# Patient Record
Sex: Female | Born: 1940 | Race: White | Hispanic: No | Marital: Married | Smoking: Never smoker
Health system: Southern US, Community
[De-identification: ages and names within clinical notes are randomized; demographics above are authoritative.]

## PROBLEM LIST (undated history)

## (undated) DIAGNOSIS — M1990 Unspecified osteoarthritis, unspecified site: Secondary | ICD-10-CM

## (undated) DIAGNOSIS — I10 Essential (primary) hypertension: Secondary | ICD-10-CM

## (undated) DIAGNOSIS — R011 Cardiac murmur, unspecified: Secondary | ICD-10-CM

## (undated) DIAGNOSIS — C801 Malignant (primary) neoplasm, unspecified: Secondary | ICD-10-CM

## (undated) HISTORY — DX: Unspecified osteoarthritis, unspecified site: M19.90

## (undated) HISTORY — PX: TOTAL KNEE ARTHROPLASTY: SHX125

## (undated) HISTORY — DX: Essential (primary) hypertension: I10

## (undated) HISTORY — PX: TONSILLECTOMY: SUR1361

## (undated) HISTORY — PX: ABDOMINAL HYSTERECTOMY: SHX81

## (undated) HISTORY — DX: Malignant (primary) neoplasm, unspecified: C80.1

## (undated) HISTORY — PX: TUBAL LIGATION: SHX77

## (undated) HISTORY — DX: Cardiac murmur, unspecified: R01.1

## (undated) HISTORY — PX: EYE SURGERY: SHX253

---

## 1998-02-24 ENCOUNTER — Ambulatory Visit (HOSPITAL_COMMUNITY): Admission: RE | Admit: 1998-02-24 | Discharge: 1998-02-24 | Payer: Self-pay | Admitting: Unknown Physician Specialty

## 2000-02-14 ENCOUNTER — Encounter: Payer: Self-pay | Admitting: Family Medicine

## 2000-02-14 ENCOUNTER — Encounter: Admission: RE | Admit: 2000-02-14 | Discharge: 2000-02-14 | Payer: Self-pay | Admitting: Family Medicine

## 2001-03-04 ENCOUNTER — Encounter: Admission: RE | Admit: 2001-03-04 | Discharge: 2001-03-04 | Payer: Self-pay | Admitting: Family Medicine

## 2001-03-04 ENCOUNTER — Encounter: Payer: Self-pay | Admitting: Family Medicine

## 2002-03-18 ENCOUNTER — Encounter: Payer: Self-pay | Admitting: Family Medicine

## 2002-03-18 ENCOUNTER — Encounter: Admission: RE | Admit: 2002-03-18 | Discharge: 2002-03-18 | Payer: Self-pay | Admitting: Family Medicine

## 2003-03-23 ENCOUNTER — Encounter: Admission: RE | Admit: 2003-03-23 | Discharge: 2003-03-23 | Payer: Self-pay | Admitting: Family Medicine

## 2004-04-13 ENCOUNTER — Encounter: Admission: RE | Admit: 2004-04-13 | Discharge: 2004-04-13 | Payer: Self-pay | Admitting: Family Medicine

## 2004-05-31 ENCOUNTER — Ambulatory Visit (HOSPITAL_COMMUNITY): Admission: RE | Admit: 2004-05-31 | Discharge: 2004-05-31 | Payer: Self-pay | Admitting: Gastroenterology

## 2005-05-01 ENCOUNTER — Encounter: Admission: RE | Admit: 2005-05-01 | Discharge: 2005-05-01 | Payer: Self-pay | Admitting: Family Medicine

## 2006-05-04 ENCOUNTER — Encounter: Admission: RE | Admit: 2006-05-04 | Discharge: 2006-05-04 | Payer: Self-pay | Admitting: Family Medicine

## 2007-05-14 ENCOUNTER — Encounter: Admission: RE | Admit: 2007-05-14 | Discharge: 2007-05-14 | Payer: Self-pay | Admitting: Family Medicine

## 2008-05-19 ENCOUNTER — Encounter: Admission: RE | Admit: 2008-05-19 | Discharge: 2008-05-19 | Payer: Self-pay | Admitting: Internal Medicine

## 2008-05-21 ENCOUNTER — Encounter: Admission: RE | Admit: 2008-05-21 | Discharge: 2008-05-21 | Payer: Self-pay | Admitting: Internal Medicine

## 2009-05-24 ENCOUNTER — Encounter: Admission: RE | Admit: 2009-05-24 | Discharge: 2009-05-24 | Payer: Self-pay | Admitting: Internal Medicine

## 2010-05-25 ENCOUNTER — Encounter
Admission: RE | Admit: 2010-05-25 | Discharge: 2010-05-25 | Payer: Self-pay | Source: Home / Self Care | Attending: Internal Medicine | Admitting: Internal Medicine

## 2010-09-30 NOTE — Op Note (Signed)
[name], [name]               ACCOUNT NO.:  [id number]   MEDICAL RECORD NO.:  [id number]          PATIENT TYPE:  AMB   LOCATION:  ENDO                         FACILITY:  [facility]   PHYSICIAN:  James L. Malon Kindle., M.D.DATE OF BIRTH:  [birth year]   DATE OF PROCEDURE:  05/31/2004  DATE OF DISCHARGE:                                 OPERATIVE REPORT   PROCEDURE:  Colonoscopy.   MEDICATIONS:  1.  Fentanyl 60 mcg.  2.  Versed 7.5 mg IV.   SCOPE:  Olympus pediatric adjustable colonoscope.   INDICATION:  Colon cancer screening.   DESCRIPTION OF PROCEDURE:  The procedure had been explained to the patient  and consent obtained.  The patient in left lateral decubitus position, the  Olympus scope was inserted and advanced.  The prep was excellent.  The  patient has slight diverticulosis.  We were able to pass this easily and  advance to the cecum.  The ileocecal valve was seen.  The scope was  withdrawn, and the cecum, ascending colon, transverse colon, splenic  flexure, descending and sigmoid colon were seen well.  The scope was  withdrawn.  The patient tolerated the procedure well.  No polyps were seen  throughout the entire colon.  In addition, the patient had received  Phenergan 12.5 mg IV due to complaints of nausea with previous sedations.   ASSESSMENT:  Normal screening colonoscopy.  V76.51.   PLAN:  We will recommend yearly Hemoccults and possibly repeat procedure in  10 years.       JLE/MEDQ  D:  05/31/2004  T:  05/31/2004  Job:  [id number]   cc:   Duncan Dull, M.D.  [address]  [place]  [place] [address]  Fax: [phone]

## 2011-05-01 ENCOUNTER — Other Ambulatory Visit: Payer: Self-pay | Admitting: Internal Medicine

## 2011-05-01 DIAGNOSIS — Z1231 Encounter for screening mammogram for malignant neoplasm of breast: Secondary | ICD-10-CM

## 2011-05-31 ENCOUNTER — Ambulatory Visit: Payer: Self-pay

## 2011-06-02 ENCOUNTER — Ambulatory Visit: Payer: Self-pay

## 2011-06-16 ENCOUNTER — Ambulatory Visit
Admission: RE | Admit: 2011-06-16 | Discharge: 2011-06-16 | Disposition: A | Discharge status: Home / Self Care | Payer: Medicare Other | Source: Ambulatory Visit | Attending: Internal Medicine | Admitting: Internal Medicine

## 2011-06-16 DIAGNOSIS — Z1231 Encounter for screening mammogram for malignant neoplasm of breast: Secondary | ICD-10-CM

## 2012-06-13 ENCOUNTER — Other Ambulatory Visit: Payer: Self-pay | Admitting: Internal Medicine

## 2012-06-13 DIAGNOSIS — Z1231 Encounter for screening mammogram for malignant neoplasm of breast: Secondary | ICD-10-CM

## 2012-07-11 ENCOUNTER — Ambulatory Visit
Admission: RE | Admit: 2012-07-11 | Discharge: 2012-07-11 | Disposition: A | Discharge status: Home / Self Care | Payer: Medicare Other | Source: Ambulatory Visit | Attending: Internal Medicine | Admitting: Internal Medicine

## 2012-07-11 DIAGNOSIS — Z1231 Encounter for screening mammogram for malignant neoplasm of breast: Secondary | ICD-10-CM

## 2013-06-23 ENCOUNTER — Other Ambulatory Visit: Payer: Self-pay

## 2013-06-23 DIAGNOSIS — Z1231 Encounter for screening mammogram for malignant neoplasm of breast: Secondary | ICD-10-CM

## 2013-07-14 ENCOUNTER — Ambulatory Visit
Admission: RE | Admit: 2013-07-14 | Discharge: 2013-07-14 | Disposition: A | Discharge status: Home / Self Care | Payer: Medicare Other | Source: Ambulatory Visit

## 2013-07-14 DIAGNOSIS — Z1231 Encounter for screening mammogram for malignant neoplasm of breast: Secondary | ICD-10-CM

## 2014-07-07 ENCOUNTER — Other Ambulatory Visit: Payer: Self-pay

## 2014-07-07 DIAGNOSIS — Z1231 Encounter for screening mammogram for malignant neoplasm of breast: Secondary | ICD-10-CM

## 2014-07-16 ENCOUNTER — Ambulatory Visit
Admission: RE | Admit: 2014-07-16 | Discharge: 2014-07-16 | Disposition: A | Discharge status: Home / Self Care | Payer: Medicare Other | Source: Ambulatory Visit

## 2014-07-16 DIAGNOSIS — Z1231 Encounter for screening mammogram for malignant neoplasm of breast: Secondary | ICD-10-CM

## 2015-06-18 ENCOUNTER — Other Ambulatory Visit: Payer: Self-pay

## 2015-06-18 DIAGNOSIS — Z1231 Encounter for screening mammogram for malignant neoplasm of breast: Secondary | ICD-10-CM

## 2015-07-19 ENCOUNTER — Ambulatory Visit
Admission: RE | Admit: 2015-07-19 | Discharge: 2015-07-19 | Disposition: A | Discharge status: Home / Self Care | Payer: Medicare Other | Source: Ambulatory Visit

## 2015-07-19 DIAGNOSIS — Z1231 Encounter for screening mammogram for malignant neoplasm of breast: Secondary | ICD-10-CM

## 2016-06-14 ENCOUNTER — Other Ambulatory Visit: Payer: Self-pay | Admitting: Internal Medicine

## 2016-06-14 DIAGNOSIS — Z1231 Encounter for screening mammogram for malignant neoplasm of breast: Secondary | ICD-10-CM

## 2016-07-20 ENCOUNTER — Ambulatory Visit
Admission: RE | Admit: 2016-07-20 | Discharge: 2016-07-20 | Disposition: A | Discharge status: Home / Self Care | Payer: Medicare Other | Source: Ambulatory Visit | Attending: Internal Medicine | Admitting: Internal Medicine

## 2016-07-20 DIAGNOSIS — Z1231 Encounter for screening mammogram for malignant neoplasm of breast: Secondary | ICD-10-CM

## 2017-06-26 ENCOUNTER — Other Ambulatory Visit: Payer: Self-pay | Admitting: Internal Medicine

## 2017-06-26 DIAGNOSIS — Z1231 Encounter for screening mammogram for malignant neoplasm of breast: Secondary | ICD-10-CM

## 2017-07-23 ENCOUNTER — Ambulatory Visit
Admission: RE | Admit: 2017-07-23 | Discharge: 2017-07-23 | Disposition: A | Discharge status: Home / Self Care | Payer: Medicare Other | Source: Ambulatory Visit | Attending: Internal Medicine | Admitting: Internal Medicine

## 2017-07-23 DIAGNOSIS — Z1231 Encounter for screening mammogram for malignant neoplasm of breast: Secondary | ICD-10-CM

## 2018-06-13 ENCOUNTER — Other Ambulatory Visit: Payer: Self-pay | Admitting: Internal Medicine

## 2018-06-13 DIAGNOSIS — Z1231 Encounter for screening mammogram for malignant neoplasm of breast: Secondary | ICD-10-CM

## 2018-07-25 ENCOUNTER — Ambulatory Visit
Admission: RE | Admit: 2018-07-25 | Discharge: 2018-07-25 | Disposition: A | Discharge status: Home / Self Care | Payer: Medicare Other | Source: Ambulatory Visit | Attending: Internal Medicine | Admitting: Internal Medicine

## 2018-07-25 DIAGNOSIS — Z1231 Encounter for screening mammogram for malignant neoplasm of breast: Secondary | ICD-10-CM

## 2019-06-15 ENCOUNTER — Ambulatory Visit: Payer: Medicare Other

## 2019-07-06 ENCOUNTER — Ambulatory Visit: Payer: Medicare Other

## 2019-12-10 NOTE — Patient Instructions (Addendum)
DUE TO COVID-19 ONLY ONE VISITOR IS ALLOWED TO COME WITH YOU AND STAY IN THE WAITING ROOM ONLY DURING PRE OP AND PROCEDURE DAY OF SURGERY. THE 1 VISITOR MAY VISIT WITH YOU AFTER SURGERY IN YOUR PRIVATE ROOM DURING VISITING HOURS ONLY!  YOU NEED TO HAVE A COVID 19 TEST ON: 12/18/19 @ 10:00 am  , THIS TEST MUST BE DONE BEFORE SURGERY, COME  COVID TESTING SITE [address] [place] [address], IT IS ON THE RIGHT GOING OUT [address] APPROXIT[address]TELY 2 MINUTES PAST [employer] ON THE RIGHT. ONCE YOUR COVID TEST IS COMPLETED,  PLEASE BEGIN THE QUARANTINE INSTRUCTIONS AS OUTLINED IN YOUR HANDOUT.                Vicki Mallet    Your procedure is scheduled on: 12/22/19   Report to [facility] Main  Entrance   Report to admitting at: 10:10 AM     Call this number if you have problems the morning of surgery [phone]    Remember:   NO SOLID FOOD AFTER MIDNIGHT THE NIGHT PRIOR TO SURGERY. NOTHING BY MOUTH EXCEPT CLEAR LIQUIDS UNTIL : 9:30 am. PLEASE FINISH ENSURE DRINK PER SURGEON ORDER  WHICH NEEDS TO BE COMPLETED AT: 9:30 am .   CLEAR LIQUID DIET   Foods Allowed                                                                     Foods Excluded  Coffee and tea, regular and decaf                             liquids that you cannot  Plain Jell-O any favor except red or purple                                           see through such as: Fruit ices (not with fruit pulp)                                     milk, soups, orange juice  Iced Popsicles                                    All solid food Carbonated beverages, regular and diet                                    Cranberry, grape and apple juices Sports drinks like Gatorade Lightly seasoned clear broth or consume(fat free) Sugar, honey syrup  Sample Menu Breakfast                                Lunch  Supper Cranberry juice                    Beef broth                             Chicken broth Jell-O                                     Grape juice                           Apple juice Coffee or tea                        Jell-O                                      Popsicle                                                Coffee or tea                        Coffee or tea  _____________________________________________________________________   BRUSH YOUR TEETH MORNING OF SURGERY AND RINSE YOUR MOUTH OUT, NO CHEWING GUM CANDY OR MINTS.     Take these medicines the morning of surgery with A SIP OF WATER: finasteride,metoprolol,omeprazole.Eye drops as usual.                                You may not have any metal on your body including hair pins and              piercings  Do not wear jewelry, make-up, lotions, powders or perfumes, deodorant             Do not wear nail polish on your fingernails.  Do not shave  48 hours prior to surgery.     Do not bring valuables to the hospital. [place].  Contacts, dentures or bridgework may not be worn into surgery.  Leave suitcase in the car. After surgery it may be brought to your room.     Patients discharged the day of surgery will not be allowed to drive home. IF YOU ARE HAVING SURGERY AND GOING HOME THE SAME DAY, YOU MUST HAVE AN ADULT TO DRIVE YOU HOME AND BE WITH YOU FOR 24 HOURS. YOU MAY GO HOME BY TAXI OR UBER OR ORTHERWISE, BUT AN ADULT MUST ACCOMPANY YOU HOME AND STAY WITH YOU FOR 24 HOURS.  Name and phone number of your driver:  Special Instructions: N/A              Please read over the following fact sheets you were given: _____________________________________________________________________          [facility] - Preparing for Surgery Before surgery, you can play an important role.  Because skin is not sterile, your skin needs to be as free  of germs as possible.  You can reduce the number of germs on your skin by washing with CHG (chlorahexidine  gluconate) soap before surgery.  CHG is an antiseptic cleaner which kills germs and bonds with the skin to continue killing germs even after washing. Please DO NOT use if you have an allergy to CHG or antibacterial soaps.  If your skin becomes reddened/irritated stop using the CHG and inform your nurse when you arrive at Short Stay. Do not shave (including legs and underarms) for at least 48 hours prior to the first CHG shower.  You may shave your face/neck. Please follow these instructions carefully:  1.  Shower with CHG Soap the night before surgery and the  morning of Surgery.  2.  If you choose to wash your hair, wash your hair first as usual with your  normal  shampoo.  3.  After you shampoo, rinse your hair and body thoroughly to remove the  shampoo.                           4.  Use CHG as you would any other liquid soap.  You can apply chg directly  to the skin and wash                       Gently with a scrungie or clean washcloth.  5.  Apply the CHG Soap to your body ONLY FROM THE NECK DOWN.   Do not use on face/ open                           Wound or open sores. Avoid contact with eyes, ears mouth and genitals (private parts).                       Wash face,  Genitals (private parts) with your normal soap.             6.  Wash thoroughly, paying special attention to the area where your surgery  will be performed.  7.  Thoroughly rinse your body with warm water from the neck down.  8.  DO NOT shower/wash with your normal soap after using and rinsing off  the CHG Soap.                9.  Pat yourself dry with a clean towel.            10.  Wear clean pajamas.            11.  Place clean sheets on your bed the night of your first shower and do not  sleep with pets. Day of Surgery : Do not apply any lotions/deodorants the morning of surgery.  Please wear clean clothes to the hospital/surgery center.  FAILURE TO FOLLOW THESE INSTRUCTIONS MAY RESULT IN THE CANCELLATION OF YOUR  SURGERY PATIENT SIGNATURE_________________________________  NURSE SIGNATURE__________________________________  ________________________________________________________________________   Adam Phenix  An incentive spirometer is a tool that can help keep your lungs clear and active. This tool measures how well you are filling your lungs with each breath. Taking long deep breaths may help reverse or decrease the chance of developing breathing (pulmonary) problems (especially infection) following:  A long period of time when you are unable to move or be active. BEFORE THE PROCEDURE   If the spirometer includes an indicator to show your best effort, your  nurse or respiratory therapist will set it to a desired goal.  If possible, sit up straight or lean slightly forward. Try not to slouch.  Hold the incentive spirometer in an upright position. INSTRUCTIONS FOR USE  1. Sit on the edge of your bed if possible, or sit up as far as you can in bed or on a chair. 2. Hold the incentive spirometer in an upright position. 3. Breathe out normally. 4. Place the mouthpiece in your mouth and seal your lips tightly around it. 5. Breathe in slowly and as deeply as possible, raising the piston or the ball toward the top of the column. 6. Hold your breath for 3-5 seconds or for as long as possible. Allow the piston or ball to fall to the bottom of the column. 7. Remove the mouthpiece from your mouth and breathe out normally. 8. Rest for a few seconds and repeat Steps 1 through 7 at least 10 times every 1-2 hours when you are awake. Take your time and take a few normal breaths between deep breaths. 9. The spirometer may include an indicator to show your best effort. Use the indicator as a goal to work toward during each repetition. 10. After each set of 10 deep breaths, practice coughing to be sure your lungs are clear. If you have an incision (the cut made at the time of surgery), support your  incision when coughing by placing a pillow or rolled up towels firmly against it. Once you are able to get out of bed, walk around indoors and cough well. You may stop using the incentive spirometer when instructed by your caregiver.  RISKS AND COMPLICATIONS  Take your time so you do not get dizzy or light-headed.  If you are in pain, you may need to take or ask for pain medication before doing incentive spirometry. It is harder to take a deep breath if you are having pain. AFTER USE  Rest and breathe slowly and easily.  It can be helpful to keep track of a log of your progress. Your caregiver can provide you with a simple table to help with this. If you are using the spirometer at home, follow these instructions: [place] IF:   You are having difficultly using the spirometer.  You have trouble using the spirometer as often as instructed.  Your pain medication is not giving enough relief while using the spirometer.  You develop fever of 100.5 F (38.1 C) or higher. SEEK IMMEDIATE MEDICAL CARE IF:   You cough up bloody sputum that had not been present before.  You develop fever of 102 F (38.9 C) or greater.  You develop worsening pain at or near the incision site. MAKE SURE YOU:   Understand these instructions.  Will watch your condition.  Will get help right away if you are not doing well or get worse. Document Released: 09/11/2006 Document Revised: 07/24/2011 Document Reviewed: 11/12/2006 [facility] Patient Information 2014 [place], [place].   ________________________________________________________________________

## 2019-12-11 ENCOUNTER — Other Ambulatory Visit: Payer: Self-pay

## 2019-12-11 ENCOUNTER — Encounter (HOSPITAL_COMMUNITY): Payer: Self-pay

## 2019-12-11 ENCOUNTER — Encounter (HOSPITAL_COMMUNITY)
Admission: RE | Admit: 2019-12-11 | Discharge: 2019-12-11 | Disposition: A | Discharge status: Home / Self Care | Payer: Medicare Other | Source: Ambulatory Visit | Attending: Orthopedic Surgery | Admitting: Orthopedic Surgery

## 2019-12-11 DIAGNOSIS — Z01812 Encounter for preprocedural laboratory examination: Secondary | ICD-10-CM | POA: Insufficient documentation

## 2019-12-11 LAB — TYPE AND SCREEN
ABO/RH(D): O POS
Antibody Screen: NEGATIVE

## 2019-12-11 LAB — COMPREHENSIVE METABOLIC PANEL
ALT: 12 U/L (ref 0–44)
AST: 17 U/L (ref 15–41)
Albumin: 4.1 g/dL (ref 3.5–5.0)
Alkaline Phosphatase: 64 U/L (ref 38–126)
Anion gap: 8 (ref 5–15)
BUN: 16 mg/dL (ref 8–23)
CO2: 27 mmol/L (ref 22–32)
Calcium: 9.7 mg/dL (ref 8.9–10.3)
Chloride: 105 mmol/L (ref 98–111)
Creatinine, Ser: 0.69 mg/dL (ref 0.44–1.00)
GFR calc Af Amer: >60 mL/min (ref >60)
GFR calc non Af Amer: >60 mL/min (ref >60)
Glucose, Bld: 107 mg/dL — ABNORMAL HIGH (ref 70–99)
Potassium: 4.6 mmol/L (ref 3.5–5.1)
Sodium: 140 mmol/L (ref 135–145)
Total Bilirubin: 0.4 mg/dL (ref 0.3–1.2)
Total Protein: 6.6 g/dL (ref 6.5–8.1)

## 2019-12-11 LAB — CBC
HCT: 44.3 % (ref 36.0–46.0)
Hemoglobin: 14.9 g/dL (ref 12.0–15.0)
MCH: 33 pg (ref 26.0–34.0)
MCHC: 33.6 g/dL (ref 30.0–36.0)
MCV: 98 fL (ref 80.0–100.0)
Platelets: 192 10*3/uL (ref 150–400)
RBC: 4.52 MIL/uL (ref 3.87–5.11)
RDW: 12.5 % (ref 11.5–15.5)
WBC: 7.4 10*3/uL (ref 4.0–10.5)
nRBC: 0 % (ref 0.0–0.2)

## 2019-12-11 LAB — PROTIME-INR
INR: 1 (ref 0.8–1.2)
Prothrombin Time: 12.9 seconds (ref 11.4–15.2)

## 2019-12-11 LAB — SURGICAL PCR SCREEN
MRSA, PCR: NEGATIVE
Staphylococcus aureus: NEGATIVE

## 2019-12-11 LAB — APTT: aPTT: 27 seconds (ref 24–36)

## 2019-12-11 NOTE — Progress Notes (Addendum)
COVID Vaccine Completed: Date COVID Vaccine completed: COVID vaccine manufacturer: [employer]   PCP - Dr. Adline Mango. LOV: 10/29/19. EPIC Cardiologist - No  Chest x-ray -  EKG - 04/30/19. Trace requested.: Chart. Stress Test -  ECHO -  Cardiac Cath -   Sleep Study -  CPAP -   Fasting Blood Sugar -  Checks Blood Sugar _____ times a day  Blood Thinner Instructions: Aspirin Instructions: Last Dose:  Anesthesia review:   Patient denies shortness of breath, fever, cough and chest pain at PAT appointment   Patient verbalized understanding of instructions that were given to them at the PAT appointment. Patient was also instructed that they will need to review over the PAT instructions again at home before surgery.

## 2019-12-18 ENCOUNTER — Other Ambulatory Visit (HOSPITAL_COMMUNITY)
Admission: RE | Admit: 2019-12-18 | Discharge: 2019-12-18 | Disposition: A | Discharge status: Home / Self Care | Payer: Medicare Other | Source: Ambulatory Visit | Attending: Orthopedic Surgery | Admitting: Orthopedic Surgery

## 2019-12-18 DIAGNOSIS — Z01812 Encounter for preprocedural laboratory examination: Secondary | ICD-10-CM | POA: Insufficient documentation

## 2019-12-18 DIAGNOSIS — Z20822 Contact with and (suspected) exposure to covid-19: Secondary | ICD-10-CM | POA: Insufficient documentation

## 2019-12-18 LAB — SARS CORONAVIRUS 2 (TAT 6-24 HRS): SARS Coronavirus 2: NEGATIVE

## 2019-12-21 MED ORDER — BUPIVACAINE LIPOSOME 1.3 % IJ SUSP
20.0000 mL | Freq: Once | INTRAMUSCULAR | Status: DC
  Filled 2019-12-21: qty 20

## 2019-12-21 NOTE — H&P (Signed)
TOTAL KNEE ADMISSION H&P  Patient is being admitted for left total knee arthroplasty.  Subjective:  Chief Complaint:left knee pain.  HPI: [name], 79 y.o. female, has a history of pain and functional disability in the left knee due to arthritis and has failed non-surgical conservative treatments for greater than 12 weeks to includecorticosteriod injections and activity modification.  Onset of symptoms was gradual, starting 2 years ago with gradually worsening course since that time. The patient noted no past surgery on the left knee(s).  Patient currently rates pain in the left knee(s) at 8 out of 10 with activity. Patient has worsening of pain with activity and weight bearing and pain that interferes with activities of daily living.  Patient has evidence of joint space narrowing by imaging studies. There is no active infection.  There are no problems to display for this patient.  Past Medical History:  Diagnosis Date   Arthritis    Cancer ([place])    lt. arm skin cancer   Heart murmur    Hypertension     Past Surgical History:  Procedure Laterality Date   ABDOMINAL HYSTERECTOMY     EYE SURGERY Bilateral    cataracts   TONSILLECTOMY     TOTAL KNEE ARTHROPLASTY Right    TUBAL LIGATION      Current Facility-Administered Medications  Medication Dose Route Frequency Provider Last Rate Last Admin   [START ON 12/22/2019] bupivacaine liposome (EXPAREL) 1.3 % injection 266 mg  20 mL Other Once Minda Ditto, [facility]       Current Outpatient Medications  Medication Sig Dispense Refill Last Dose   acetaminophen (TYLENOL) 500 MG tablet Take 500-1,000 mg by mouth every 6 (six) hours as needed (for pain.).      alendronate (FOSAMAX) 70 MG tablet Take 70 mg by mouth every Monday.      amoxicillin (AMOXIL) 500 MG capsule Take 2,000 mg by mouth See admin instructions. Take 4 capsules (2000 mg) by mouth 1 hour prior to dental procedure      Cholecalciferol (VITAMIN D-3) 125 MCG  (5000 UT) TABS Take 5,000 Units by mouth daily.      finasteride (PROSCAR) 5 MG tablet Take 2.5 mg by mouth daily.      Glucosamine HCl 1500 MG TABS Take 1,500 mg by mouth in the morning and at bedtime.      Hydrocortisone (YBOFBPZWC-58 EX) Apply 1 application topically 3 (three) times daily as needed (itching skin.).      ketotifen (ZADITOR) 0.025 % ophthalmic solution Place 1 drop into both eyes 2 (two) times daily as needed (allergy/itching eyes.).      lisinopril (ZESTRIL) 10 MG tablet Take 10 mg by mouth daily.       Magnesium 500 MG TABS Take 500 mg by mouth at bedtime.      metoprolol succinate (TOPROL-XL) 25 MG 24 hr tablet Take 25 mg by mouth daily.      metroNIDAZOLE (METROCREAM) 0.75 % cream Apply 1 application topically 2 (two) times daily as needed (rosacea.).       mometasone (ELOCON) 0.1 % cream Apply 1 application topically daily as needed (ear itching).      Multiple Vitamins-Minerals (PRESERVISION AREDS 2 PO) Take 1 tablet by mouth in the morning and at bedtime.      Olopatadine HCl (PATADAY OP) Place 1 drop into both eyes 3 (three) times daily as needed (allergy/irritated eyes.).      omeprazole (PRILOSEC OTC) 20 MG tablet Take 10-20 mg by  mouth daily. Take 1 tablet (20 mg) by mouth on Mondays, Wednesdays, & Fridays at night Take 0.5 tablet (10 mg) by mouth on Sundays, Tuesdays, Thursdays, & Saturdays at night.      OVER THE COUNTER MEDICATION Take 20 mg by mouth daily. Extract of Saffron      pravastatin (PRAVACHOL) 10 MG tablet Take 10 mg by mouth See admin instructions. Take 1 tablet (10 mg) by mouth on Sundays, Mondays, Wednesdays, & Saturdays at night.      pravastatin (PRAVACHOL) 20 MG tablet Take 20 mg by mouth every Tuesday, Thursday, and Saturday at 6 PM.      Allergies  Allergen Reactions   Codeine Nausea Only   Other Nausea And Vomiting    opioids    Penicillins Rash    Social History   Tobacco Use   Smoking status: Never Smoker    Smokeless tobacco: Never Used  Substance Use Topics   Alcohol use: Never    Family History  Problem Relation Age of Onset   Breast cancer Maternal Aunt        unsure of age   Breast cancer Paternal Aunt        unsure of age   Breast cancer Cousin        unsure of age     Review of Systems  Constitutional: Negative for chills and fever.  Respiratory: Negative for cough and shortness of breath.   Cardiovascular: Negative for chest pain.  Gastrointestinal: Negative for nausea and vomiting.  Musculoskeletal: Positive for arthralgias.    Objective:  Physical Exam Patient is a 79 year old female.  Well nourished and well developed. General: Alert and oriented x3, cooperative and pleasant, no acute distress. Head: normocephalic, atraumatic, neck supple. Eyes: EOMI. Respiratory: breath sounds clear in all fields, no wheezing, rales, or rhonchi. Cardiovascular: Regular rate and rhythm, no murmurs, gallops or rubs.  Musculoskeletal: Left Knee Exam: Slight valgus deformity. No effusion present. No swelling present. The range of motion is: 5 to 120 degrees. No crepitus on range of motion of the knee. No medial joint line tenderness. Significant lateral joint line tenderness. The knee is stable.  Calves soft and nontender. Motor function intact in LE. Strength 5/5 LE bilaterally. Neuro: Distal pulses 2+. Sensation to light touch intact in LE.  Vital signs in last 24 hours:    Labs:   Estimated body mass index is 25.87 kg/m as calculated from the following:   Height as of 12/11/19: 5\' 6"  (1.676 m).   Weight as of 12/11/19: 72.7 kg.   Imaging Review Plain radiographs demonstrate severe degenerative joint disease of the left knee(s). The overall alignment isneutral. The bone quality appears to be adequate for age and reported activity level.      Assessment/Plan:  End stage arthritis, left knee   The patient history, physical examination, clinical judgment of  the provider and imaging studies are consistent with end stage degenerative joint disease of the left knee(s) and total knee arthroplasty is deemed medically necessary. The treatment options including medical management, injection therapy arthroscopy and arthroplasty were discussed at length. The risks and benefits of total knee arthroplasty were presented and reviewed. The risks due to aseptic loosening, infection, stiffness, patella tracking problems, thromboembolic complications and other imponderables were discussed. The patient acknowledged the explanation, agreed to proceed with the plan and consent was signed. Patient is being admitted for inpatient treatment for surgery, pain control, PT, OT, prophylactic antibiotics, VTE prophylaxis, progressive ambulation and ADL's and discharge  planning. The patient is planning to be discharged home.   Therapy Plans: outpatient therapy at [facility] Disposition: Home with husband Planned DVT Prophylaxis: aspirin 325mg  BID DME needed: none PCP: Dr. Rozetta Nunnery, clearance received TXA: IV Allergies: PCN - rash, opioids - nausea/vomiting Anesthesia Concerns: nausea BMI: 26.5 Not diabetic.  Other: Needs Zofran with pain medication.    - Patient was instructed on what medications to stop prior to surgery. - Follow-up visit in 2 weeks with Dr. Wynelle Link - Begin physical therapy following surgery - Pre-operative lab work as pre-surgical testing - Prescriptions will be provided in hospital at time of discharge   Patient's anticipated LOS is less than 2 midnights, meeting these requirements: - Younger than 37 - Lives within 1 hour of care - Has a competent adult at home to recover with post-op recover - NO history of  - Chronic pain requiring opiods  - Diabetes  - Coronary Artery Disease  - Heart failure  - Heart attack  - Stroke  - DVT/VTE  - Cardiac arrhythmia  - Respiratory Failure/COPD  - Renal failure  - Anemia  - Advanced Liver  disease   Griffith Citron, PA-C Orthopedic Surgery [facility] [place] [phone]

## 2019-12-22 ENCOUNTER — Observation Stay (HOSPITAL_COMMUNITY)
Admission: RE | Admit: 2019-12-22 | Discharge: 2019-12-23 | Disposition: A | Discharge status: Home / Self Care | Payer: Medicare Other | Attending: Orthopedic Surgery | Admitting: Orthopedic Surgery

## 2019-12-22 ENCOUNTER — Encounter (HOSPITAL_COMMUNITY): Payer: Self-pay | Admitting: Orthopedic Surgery

## 2019-12-22 ENCOUNTER — Other Ambulatory Visit: Payer: Self-pay

## 2019-12-22 ENCOUNTER — Ambulatory Visit (HOSPITAL_COMMUNITY): Payer: Medicare Other | Admitting: Anesthesiology

## 2019-12-22 ENCOUNTER — Encounter (HOSPITAL_COMMUNITY)
Admission: RE | Disposition: A | Discharge status: Home / Self Care | Payer: Self-pay | Source: Home / Self Care | Attending: Orthopedic Surgery

## 2019-12-22 DIAGNOSIS — M179 Osteoarthritis of knee, unspecified: Secondary | ICD-10-CM

## 2019-12-22 DIAGNOSIS — Z85828 Personal history of other malignant neoplasm of skin: Secondary | ICD-10-CM | POA: Insufficient documentation

## 2019-12-22 DIAGNOSIS — Z89521 Acquired absence of right knee: Secondary | ICD-10-CM | POA: Diagnosis not present

## 2019-12-22 DIAGNOSIS — M25562 Pain in left knee: Secondary | ICD-10-CM | POA: Diagnosis present

## 2019-12-22 DIAGNOSIS — Z79899 Other long term (current) drug therapy: Secondary | ICD-10-CM | POA: Insufficient documentation

## 2019-12-22 DIAGNOSIS — I10 Essential (primary) hypertension: Secondary | ICD-10-CM | POA: Insufficient documentation

## 2019-12-22 DIAGNOSIS — M1712 Unilateral primary osteoarthritis, left knee: Secondary | ICD-10-CM | POA: Diagnosis not present

## 2019-12-22 HISTORY — PX: TOTAL KNEE ARTHROPLASTY: SHX125

## 2019-12-22 LAB — ABO/RH: ABO/RH(D): O POS

## 2019-12-22 SURGERY — ARTHROPLASTY, KNEE, TOTAL
Anesthesia: Spinal | Site: Knee | Laterality: Left

## 2019-12-22 MED ORDER — SODIUM CHLORIDE (PF) 0.9 % IJ SOLN
INTRAMUSCULAR | Status: AC
  Filled 2019-12-22: qty 50

## 2019-12-22 MED ORDER — ORAL CARE MOUTH RINSE: 15.0000 mL | Freq: Once | OROMUCOSAL | Status: AC

## 2019-12-22 MED ORDER — STERILE WATER FOR IRRIGATION IR SOLN
Status: DC | PRN
  Administered 2019-12-22: 2000 mL

## 2019-12-22 MED ORDER — CEFAZOLIN SODIUM-DEXTROSE 2-4 GM/100ML-% IV SOLN
2.0000 g | INTRAVENOUS | Status: AC
  Administered 2019-12-22: 2 g via INTRAVENOUS
  Filled 2019-12-22: qty 100

## 2019-12-22 MED ORDER — BUPIVACAINE IN DEXTROSE 0.75-8.25 % IT SOLN
INTRATHECAL | Status: DC | PRN
  Administered 2019-12-22: 1.6 mL via INTRATHECAL

## 2019-12-22 MED ORDER — ONDANSETRON HCL 4 MG/2ML IJ SOLN
INTRAMUSCULAR | Status: DC | PRN
  Administered 2019-12-22 (×2): 4 mg via INTRAVENOUS

## 2019-12-22 MED ORDER — LACTATED RINGERS IV SOLN: INTRAVENOUS | Status: DC

## 2019-12-22 MED ORDER — SODIUM CHLORIDE 0.9 % IR SOLN
Status: DC | PRN
  Administered 2019-12-22: 1000 mL

## 2019-12-22 MED ORDER — BUPIVACAINE-EPINEPHRINE (PF) 0.5% -1:200000 IJ SOLN
INTRAMUSCULAR | Status: DC | PRN
Start: 2019-12-22 — End: 2019-12-22
  Administered 2019-12-22: 30 mL via PERINEURAL

## 2019-12-22 MED ORDER — ONDANSETRON HCL 4 MG/2ML IJ SOLN: 4.0000 mg | Freq: Four times a day (QID) | INTRAMUSCULAR | Status: DC | PRN

## 2019-12-22 MED ORDER — DEXAMETHASONE SODIUM PHOSPHATE 10 MG/ML IJ SOLN
INTRAMUSCULAR | Status: DC | PRN
  Administered 2019-12-22: 8 mg via INTRAVENOUS

## 2019-12-22 MED ORDER — ONDANSETRON HCL 4 MG/2ML IJ SOLN: 4.0000 mg | Freq: Once | INTRAMUSCULAR | Status: DC | PRN

## 2019-12-22 MED ORDER — HYDROMORPHONE HCL 2 MG PO TABS
2.0000 mg | ORAL_TABLET | ORAL | Status: DC | PRN
  Administered 2019-12-22 – 2019-12-23 (×3): 2 mg via ORAL
  Filled 2019-12-22 (×3): qty 1

## 2019-12-22 MED ORDER — DIPHENHYDRAMINE HCL 12.5 MG/5ML PO ELIX: 12.5000 mg | ORAL_SOLUTION | ORAL | Status: DC | PRN

## 2019-12-22 MED ORDER — HYDROMORPHONE HCL 1 MG/ML IJ SOLN: 0.2500 mg | INTRAMUSCULAR | Status: DC | PRN

## 2019-12-22 MED ORDER — POVIDONE-IODINE 10 % EX SWAB
2.0000 "application " | Freq: Once | CUTANEOUS | Status: AC
  Administered 2019-12-22: 2 via TOPICAL

## 2019-12-22 MED ORDER — PRAVASTATIN SODIUM 20 MG PO TABS: 20.0000 mg | ORAL_TABLET | ORAL | Status: DC

## 2019-12-22 MED ORDER — PANTOPRAZOLE SODIUM 40 MG PO TBEC
40.0000 mg | DELAYED_RELEASE_TABLET | Freq: Every day | ORAL | Status: DC
  Administered 2019-12-23: 40 mg via ORAL
  Filled 2019-12-22: qty 1

## 2019-12-22 MED ORDER — MORPHINE SULFATE (PF) 2 MG/ML IV SOLN: 0.5000 mg | INTRAVENOUS | Status: DC | PRN

## 2019-12-22 MED ORDER — SODIUM CHLORIDE 0.9 % IV SOLN
INTRAVENOUS | Status: DC
  Administered 2019-12-22: 1000 mL via INTRAVENOUS

## 2019-12-22 MED ORDER — BUPIVACAINE LIPOSOME 1.3 % IJ SUSP
INTRAMUSCULAR | Status: DC | PRN
  Administered 2019-12-22: 20 mL

## 2019-12-22 MED ORDER — CLONIDINE HCL (ANALGESIA) 100 MCG/ML EP SOLN
EPIDURAL | Status: DC | PRN
  Administered 2019-12-22: 75 ug

## 2019-12-22 MED ORDER — DOCUSATE SODIUM 100 MG PO CAPS
100.0000 mg | ORAL_CAPSULE | Freq: Two times a day (BID) | ORAL | Status: DC
  Administered 2019-12-22 – 2019-12-23 (×2): 100 mg via ORAL
  Filled 2019-12-22 (×2): qty 1

## 2019-12-22 MED ORDER — ACETAMINOPHEN 500 MG PO TABS
1000.0000 mg | ORAL_TABLET | Freq: Four times a day (QID) | ORAL | Status: DC
  Administered 2019-12-22 – 2019-12-23 (×3): 1000 mg via ORAL
  Filled 2019-12-22 (×3): qty 2

## 2019-12-22 MED ORDER — METOCLOPRAMIDE HCL 5 MG/ML IJ SOLN: 5.0000 mg | Freq: Three times a day (TID) | INTRAMUSCULAR | Status: DC | PRN

## 2019-12-22 MED ORDER — PROPOFOL 500 MG/50ML IV EMUL
INTRAVENOUS | Status: DC | PRN
  Administered 2019-12-22: 25 ug/kg/min via INTRAVENOUS

## 2019-12-22 MED ORDER — METOCLOPRAMIDE HCL 5 MG PO TABS: 5.0000 mg | ORAL_TABLET | Freq: Three times a day (TID) | ORAL | Status: DC | PRN

## 2019-12-22 MED ORDER — 0.9 % SODIUM CHLORIDE (POUR BTL) OPTIME
TOPICAL | Status: DC | PRN
  Administered 2019-12-22: 1000 mL

## 2019-12-22 MED ORDER — SODIUM CHLORIDE (PF) 0.9 % IJ SOLN
INTRAMUSCULAR | Status: AC
  Filled 2019-12-22: qty 10

## 2019-12-22 MED ORDER — BISACODYL 10 MG RE SUPP: 10.0000 mg | Freq: Every day | RECTAL | Status: DC | PRN

## 2019-12-22 MED ORDER — ACETAMINOPHEN 10 MG/ML IV SOLN
1000.0000 mg | Freq: Four times a day (QID) | INTRAVENOUS | Status: DC
  Administered 2019-12-22: 1000 mg via INTRAVENOUS
  Filled 2019-12-22: qty 100

## 2019-12-22 MED ORDER — PROPOFOL 10 MG/ML IV BOLUS
INTRAVENOUS | Status: DC | PRN
  Administered 2019-12-22: 20 mg via INTRAVENOUS
  Administered 2019-12-22: 30 mg via INTRAVENOUS
  Administered 2019-12-22 (×2): 20 mg via INTRAVENOUS

## 2019-12-22 MED ORDER — DEXAMETHASONE SODIUM PHOSPHATE 10 MG/ML IJ SOLN: 8.0000 mg | Freq: Once | INTRAMUSCULAR | Status: DC

## 2019-12-22 MED ORDER — MENTHOL 3 MG MT LOZG: 1.0000 | LOZENGE | OROMUCOSAL | Status: DC | PRN

## 2019-12-22 MED ORDER — MEPERIDINE HCL 50 MG/ML IJ SOLN: 6.2500 mg | INTRAMUSCULAR | Status: DC | PRN

## 2019-12-22 MED ORDER — CEFAZOLIN SODIUM-DEXTROSE 2-4 GM/100ML-% IV SOLN
2.0000 g | Freq: Four times a day (QID) | INTRAVENOUS | Status: AC
  Administered 2019-12-22 – 2019-12-23 (×2): 2 g via INTRAVENOUS
  Filled 2019-12-22 (×2): qty 100

## 2019-12-22 MED ORDER — CHLORHEXIDINE GLUCONATE 0.12 % MT SOLN
15.0000 mL | Freq: Once | OROMUCOSAL | Status: AC
  Administered 2019-12-22: 15 mL via OROMUCOSAL

## 2019-12-22 MED ORDER — DEXAMETHASONE SODIUM PHOSPHATE 4 MG/ML IJ SOLN
INTRAMUSCULAR | Status: DC | PRN
Start: 2019-12-22 — End: 2019-12-22
  Administered 2019-12-22: 5 mg via PERINEURAL

## 2019-12-22 MED ORDER — POLYETHYLENE GLYCOL 3350 17 G PO PACK: 17.0000 g | PACK | Freq: Every day | ORAL | Status: DC | PRN

## 2019-12-22 MED ORDER — SODIUM CHLORIDE (PF) 0.9 % IJ SOLN
INTRAMUSCULAR | Status: DC | PRN
  Administered 2019-12-22: 60 mL

## 2019-12-22 MED ORDER — FENTANYL CITRATE (PF) 100 MCG/2ML IJ SOLN
50.0000 ug | Freq: Once | INTRAMUSCULAR | Status: AC
  Administered 2019-12-22: 100 ug via INTRAVENOUS
  Filled 2019-12-22: qty 2

## 2019-12-22 MED ORDER — METOPROLOL SUCCINATE ER 25 MG PO TB24
25.0000 mg | ORAL_TABLET | Freq: Every day | ORAL | Status: DC
  Administered 2019-12-23: 25 mg via ORAL
  Filled 2019-12-22: qty 1

## 2019-12-22 MED ORDER — MIDAZOLAM HCL 2 MG/2ML IJ SOLN
1.0000 mg | Freq: Once | INTRAMUSCULAR | Status: DC
  Filled 2019-12-22: qty 2

## 2019-12-22 MED ORDER — METHOCARBAMOL 500 MG IVPB - SIMPLE MED
500.0000 mg | Freq: Four times a day (QID) | INTRAVENOUS | Status: DC | PRN
  Filled 2019-12-22: qty 50

## 2019-12-22 MED ORDER — PHENYLEPHRINE HCL-NACL 10-0.9 MG/250ML-% IV SOLN
INTRAVENOUS | Status: DC | PRN
Start: 2019-12-22 — End: 2019-12-22
  Administered 2019-12-22: 45 ug/min via INTRAVENOUS

## 2019-12-22 MED ORDER — TRANEXAMIC ACID-NACL 1000-0.7 MG/100ML-% IV SOLN
1000.0000 mg | INTRAVENOUS | Status: AC
  Administered 2019-12-22: 1000 mg via INTRAVENOUS
  Filled 2019-12-22: qty 100

## 2019-12-22 MED ORDER — FINASTERIDE 5 MG PO TABS
2.5000 mg | ORAL_TABLET | Freq: Every day | ORAL | Status: DC
  Administered 2019-12-23: 2.5 mg via ORAL
  Filled 2019-12-22: qty 0.5

## 2019-12-22 MED ORDER — LIDOCAINE 2% (20 MG/ML) 5 ML SYRINGE
INTRAMUSCULAR | Status: DC | PRN
  Administered 2019-12-22: 20 mg via INTRAVENOUS

## 2019-12-22 MED ORDER — PRAVASTATIN SODIUM 20 MG PO TABS
10.0000 mg | ORAL_TABLET | ORAL | Status: DC
  Administered 2019-12-22: 10 mg via ORAL
  Filled 2019-12-22: qty 1

## 2019-12-22 MED ORDER — FLEET ENEMA 7-19 GM/118ML RE ENEM: 1.0000 | ENEMA | Freq: Once | RECTAL | Status: DC | PRN

## 2019-12-22 MED ORDER — TRAMADOL HCL 50 MG PO TABS
50.0000 mg | ORAL_TABLET | Freq: Four times a day (QID) | ORAL | Status: DC | PRN
  Administered 2019-12-23: 100 mg via ORAL
  Filled 2019-12-22: qty 2

## 2019-12-22 MED ORDER — ASPIRIN EC 325 MG PO TBEC
325.0000 mg | DELAYED_RELEASE_TABLET | Freq: Two times a day (BID) | ORAL | Status: DC
  Administered 2019-12-23: 325 mg via ORAL
  Filled 2019-12-22: qty 1

## 2019-12-22 MED ORDER — ONDANSETRON HCL 4 MG PO TABS: 4.0000 mg | ORAL_TABLET | Freq: Four times a day (QID) | ORAL | Status: DC | PRN

## 2019-12-22 MED ORDER — GABAPENTIN 100 MG PO CAPS
100.0000 mg | ORAL_CAPSULE | Freq: Three times a day (TID) | ORAL | Status: DC
  Administered 2019-12-22 – 2019-12-23 (×3): 100 mg via ORAL
  Filled 2019-12-22 (×3): qty 1

## 2019-12-22 MED ORDER — PHENOL 1.4 % MT LIQD: 1.0000 | OROMUCOSAL | Status: DC | PRN

## 2019-12-22 MED ORDER — METHOCARBAMOL 500 MG PO TABS
500.0000 mg | ORAL_TABLET | Freq: Four times a day (QID) | ORAL | Status: DC | PRN
  Filled 2019-12-22: qty 1

## 2019-12-22 MED ORDER — DEXAMETHASONE SODIUM PHOSPHATE 10 MG/ML IJ SOLN
10.0000 mg | Freq: Once | INTRAMUSCULAR | Status: AC
  Administered 2019-12-23: 10 mg via INTRAVENOUS
  Filled 2019-12-22: qty 1

## 2019-12-22 MED ORDER — OMEPRAZOLE MAGNESIUM 20 MG PO TBEC: 20.0000 mg | DELAYED_RELEASE_TABLET | Freq: Every day | ORAL | Status: DC

## 2019-12-22 MED ORDER — OLOPATADINE HCL 0.1 % OP SOLN
1.0000 [drp] | Freq: Three times a day (TID) | OPHTHALMIC | Status: DC | PRN
  Filled 2019-12-22: qty 5

## 2019-12-22 SURGICAL SUPPLY — 52 items
BAG SPEC THK2 15X12 ZIP CLS (MISCELLANEOUS) ×1
BAG ZIPLOCK 12X15 (MISCELLANEOUS) ×3 IMPLANT
BLADE SAG 18X100X1.27 (BLADE) ×3 IMPLANT
BLADE SAW SGTL 11.0X1.19X90.0M (BLADE) ×3 IMPLANT
BLADE SURG SZ10 CARB STEEL (BLADE) ×6 IMPLANT
BNDG ELASTIC 6X5.8 VLCR STR LF (GAUZE/BANDAGES/DRESSINGS) ×3 IMPLANT
BOWL SMART MIX CTS (DISPOSABLE) ×3 IMPLANT
CEMENT HV SMART SET (Cement) ×6 IMPLANT
CEMENT TIBIA MBT (Knees) IMPLANT
CLOSURE WOUND 1/2 X4 (GAUZE/BANDAGES/DRESSINGS) ×2
COVER SURGICAL LIGHT HANDLE (MISCELLANEOUS) ×3 IMPLANT
COVER WAND RF STERILE (DRAPES) IMPLANT
CUFF TOURN SGL QUICK 34 (TOURNIQUET CUFF) ×3
CUFF TRNQT CYL 34X4.125X (TOURNIQUET CUFF) ×1 IMPLANT
DECANTER SPIKE VIAL GLASS SM (MISCELLANEOUS) ×3 IMPLANT
DRAPE U-SHAPE 47X51 STRL (DRAPES) ×3 IMPLANT
DRSG AQUACEL AG ADV 3.5X10 (GAUZE/BANDAGES/DRESSINGS) ×3 IMPLANT
DURAPREP 26ML APPLICATOR (WOUND CARE) ×3 IMPLANT
ELECT REM PT RETURN 15FT ADLT (MISCELLANEOUS) ×3 IMPLANT
FEMUR SIGMA PS KNEE SZ 4.0N L (Femur) ×2 IMPLANT
GLOVE BIO SURGEON STRL SZ7 (GLOVE) ×3 IMPLANT
GLOVE BIO SURGEON STRL SZ8 (GLOVE) ×3 IMPLANT
GLOVE BIOGEL PI IND STRL 7.0 (GLOVE) ×1 IMPLANT
GLOVE BIOGEL PI IND STRL 8 (GLOVE) ×1 IMPLANT
GLOVE BIOGEL PI INDICATOR 7.0 (GLOVE) ×2
GLOVE BIOGEL PI INDICATOR 8 (GLOVE) ×2
GOWN STRL REUS W/TWL LRG LVL3 (GOWN DISPOSABLE) ×6 IMPLANT
HANDPIECE INTERPULSE COAX TIP (DISPOSABLE) ×3
HOLDER FOLEY CATH W/STRAP (MISCELLANEOUS) IMPLANT
IMMOBILIZER KNEE 20 (SOFTGOODS) ×3
IMMOBILIZER KNEE 20 THIGH 36 (SOFTGOODS) ×1 IMPLANT
KIT TURNOVER KIT A (KITS) IMPLANT
MANIFOLD NEPTUNE II (INSTRUMENTS) ×3 IMPLANT
NS IRRIG 1000ML POUR BTL (IV SOLUTION) ×3 IMPLANT
PACK TOTAL KNEE CUSTOM (KITS) ×3 IMPLANT
PADDING CAST COTTON 6X4 STRL (CAST SUPPLIES) ×4 IMPLANT
PATELLA DOME PFC 35MM (Knees) ×2 IMPLANT
PENCIL SMOKE EVACUATOR (MISCELLANEOUS) ×3 IMPLANT
PIN STEINMAN FIXATION KNEE (PIN) ×2 IMPLANT
PLATE ROT INSERT 10MM SIZE 4 (Plate) ×2 IMPLANT
PROTECTOR NERVE ULNAR (MISCELLANEOUS) ×3 IMPLANT
SET HNDPC FAN SPRY TIP SCT (DISPOSABLE) ×1 IMPLANT
STRIP CLOSURE SKIN 1/2X4 (GAUZE/BANDAGES/DRESSINGS) ×4 IMPLANT
SUT MNCRL AB 4-0 PS2 18 (SUTURE) ×3 IMPLANT
SUT STRATAFIX 0 PDS 27 VIOLET (SUTURE) ×3
SUT VIC AB 2-0 CT1 27 (SUTURE) ×9
SUT VIC AB 2-0 CT1 TAPERPNT 27 (SUTURE) ×3 IMPLANT
SUTURE STRATFX 0 PDS 27 VIOLET (SUTURE) ×1 IMPLANT
TIBIA MBT CEMENT (Knees) ×3 IMPLANT
TRAY FOLEY MTR SLVR 16FR STAT (SET/KITS/TRAYS/PACK) ×3 IMPLANT
WATER STERILE IRR 1000ML POUR (IV SOLUTION) ×6 IMPLANT
WRAP KNEE MAXI GEL POST OP (GAUZE/BANDAGES/DRESSINGS) ×3 IMPLANT

## 2019-12-22 NOTE — Discharge Instructions (Addendum)
 Frank Aluisio, MD Total Joint Specialist [facility] [address] [place], [place] [address] [phone]  TOTAL KNEE REPLACEMENT POSTOPERATIVE DIRECTIONS    Knee Rehabilitation, Guidelines Following Surgery  Results after knee surgery are often greatly improved when you follow the exercise, range of motion and muscle strengthening exercises prescribed by your doctor. Safety measures are also important to protect the knee from further injury. If any of these exercises cause you to have increased pain or swelling in your knee joint, decrease the amount until you are comfortable again and slowly increase them. If you have problems or questions, call your caregiver or physical therapist for advice.   BLOOD CLOT PREVENTION . Take a 325 mg Aspirin two times a day for three weeks following surgery. Then take an 81 mg Aspirin once a day for three weeks. Then discontinue Aspirin. . You may resume your vitamins/supplements upon discharge from the hospital. . Do not take any NSAIDs (Advil, Aleve, Ibuprofen, Meloxicam, etc.) until you have discontinued the 325 mg Aspirin.  HOME CARE INSTRUCTIONS  . Remove items at home which could result in a fall. This includes throw rugs or furniture in walking pathways.  . ICE to the affected knee as much as tolerated. Icing helps control swelling. If the swelling is well controlled you will be more comfortable and rehab easier. Continue to use ice on the knee for pain and swelling from surgery. You may notice swelling that will progress down to the foot and ankle. This is normal after surgery. Elevate the leg when you are not up walking on it.    . Continue to use the breathing machine which will help keep your temperature down. It is common for your temperature to cycle up and down following surgery, especially at night when you are not up moving around and exerting yourself. The breathing machine keeps your lungs expanded and your  temperature down. . Do not place pillow under the operative knee, focus on keeping the knee straight while resting  DIET You may resume your previous home diet once you are discharged from the hospital.  DRESSING / WOUND CARE / SHOWERING . Keep your bulky bandage on for 2 days. On the third post-operative day you may remove the Ace bandage and gauze. There is a waterproof adhesive bandage on your skin which will stay in place until your first follow-up appointment. Once you remove this you will not need to place another bandage . You may begin showering 3 days following surgery, but do not submerge the incision under water.  ACTIVITY For the first 5 days, the key is rest and control of pain and swelling . Do your home exercises twice a day starting on post-operative day 3. On the days you go to physical therapy, just do the home exercises once that day. . You should rest, ice and elevate the leg for 50 minutes out of every hour. Get up and walk/stretch for 10 minutes per hour. After 5 days you can increase your activity slowly as tolerated. . Walk with your walker as instructed. Use the walker until you are comfortable transitioning to a cane. Walk with the cane in the opposite hand of the operative leg. You may discontinue the cane once you are comfortable and walking steadily. . Avoid periods of inactivity such as sitting longer than an hour when not asleep. This helps prevent blood clots.  . You may discontinue the knee immobilizer once you are able to perform a straight   leg raise while lying down. . You may resume a sexual relationship in one month or when given the OK by your doctor.  . You may return to work once you are cleared by your doctor.  . Do not drive a car for 6 weeks or until released by your surgeon.  . Do not drive while taking narcotics.  TED HOSE STOCKINGS Wear the elastic stockings on both legs for three weeks following surgery during the day. You may remove them at night  for sleeping.  WEIGHT BEARING Weight bearing as tolerated with assist device (walker, cane, etc) as directed, use it as long as suggested by your surgeon or therapist, typically at least 4-6 weeks.  POSTOPERATIVE CONSTIPATION PROTOCOL Constipation - defined medically as fewer than three stools per week and severe constipation as less than one stool per week.  One of the most common issues patients have following surgery is constipation.  Even if you have a regular bowel pattern at home, your normal regimen is likely to be disrupted due to multiple reasons following surgery.  Combination of anesthesia, postoperative narcotics, change in appetite and fluid intake all can affect your bowels.  In order to avoid complications following surgery, here are some recommendations in order to help you during your recovery period.  . Colace (docusate) - Pick up an over-the-counter form of Colace or another stool softener and take twice a day as long as you are requiring postoperative pain medications.  Take with a full glass of water daily.  If you experience loose stools or diarrhea, hold the colace until you stool forms back up. If your symptoms do not get better within 1 week or if they get worse, check with your doctor. . Dulcolax (bisacodyl) - Pick up over-the-counter and take as directed by the product packaging as needed to assist with the movement of your bowels.  Take with a full glass of water.  Use this product as needed if not relieved by Colace only.  . MiraLax (polyethylene glycol) - Pick up over-the-counter to have on hand. MiraLax is a solution that will increase the amount of water in your bowels to assist with bowel movements.  Take as directed and can mix with a glass of water, juice, soda, coffee, or tea. Take if you go more than two days without a movement. Do not use MiraLax more than once per day. Call your doctor if you are still constipated or irregular after using this medication for 7 days  in a row.  If you continue to have problems with postoperative constipation, please contact the office for further assistance and recommendations.  If you experience "the worst abdominal pain ever" or develop nausea or vomiting, please contact the office immediatly for further recommendations for treatment.  ITCHING If you experience itching with your medications, try taking only a single pain pill, or even half a pain pill at a time.  You can also use Benadryl over the counter for itching or also to help with sleep.   MEDICATIONS See your medication summary on the "After Visit Summary" that the nursing staff will review with you prior to discharge.  You may have some home medications which will be placed on hold until you complete the course of blood thinner medication.  It is important for you to complete the blood thinner medication as prescribed by your surgeon.  Continue your approved medications as instructed at time of discharge.  PRECAUTIONS . If you experience chest pain or shortness of   breath - call 911 immediately for transfer to the hospital emergency department.  . If you develop a fever greater that 101 F, purulent drainage from wound, increased redness or drainage from wound, foul odor from the wound/dressing, or calf pain - CONTACT YOUR SURGEON.                                                   FOLLOW-UP APPOINTMENTS Make sure you keep all of your appointments after your operation with your surgeon and caregivers. You should call the office at the above phone number and make an appointment for approximately two weeks after the date of your surgery or on the date instructed by your surgeon outlined in the "After Visit Summary".  RANGE OF MOTION AND STRENGTHENING EXERCISES  Rehabilitation of the knee is important following a knee injury or an operation. After just a few days of immobilization, the muscles of the thigh which control the knee become weakened and shrink (atrophy). Knee  exercises are designed to build up the tone and strength of the thigh muscles and to improve knee motion. Often times heat used for twenty to thirty minutes before working out will loosen up your tissues and help with improving the range of motion but do not use heat for the first two weeks following surgery. These exercises can be done on a training (exercise) mat, on the floor, on a table or on a bed. Use what ever works the best and is most comfortable for you Knee exercises include:  . Leg Lifts - While your knee is still immobilized in a splint or cast, you can do straight leg raises. Lift the leg to 60 degrees, hold for 3 sec, and slowly lower the leg. Repeat 10-20 times 2-3 times daily. Perform this exercise against resistance later as your knee gets better.  . Quad and Hamstring Sets - Tighten up the muscle on the front of the thigh (Quad) and hold for 5-10 sec. Repeat this 10-20 times hourly. Hamstring sets are done by pushing the foot backward against an object and holding for 5-10 sec. Repeat as with quad sets.   Leg Slides: Lying on your back, slowly slide your foot toward your buttocks, bending your knee up off the floor (only go as far as is comfortable). Then slowly slide your foot back down until your leg is flat on the floor again.  Angel Wings: Lying on your back spread your legs to the side as far apart as you can without causing discomfort.  A rehabilitation program following serious knee injuries can speed recovery and prevent re-injury in the future due to weakened muscles. Contact your doctor or a physical therapist for more information on knee rehabilitation.   IF YOU ARE TRANSFERRED TO A SKILLED REHAB FACILITY If the patient is transferred to a skilled rehab facility following release from the hospital, a list of the current medications will be sent to the facility for the patient to continue.  When discharged from the skilled rehab facility, please have the facility set up the  patient's Home Health Physical Therapy prior to being released. Also, the skilled facility will be responsible for providing the patient with their medications at time of release from the facility to include their pain medication, the muscle relaxants, and their blood thinner medication. If the patient is still at the   rehab facility at time of the two week follow up appointment, the skilled rehab facility will also need to assist the patient in arranging follow up appointment in our office and any transportation needs.  MAKE SURE YOU:  . Understand these instructions.  . Get help right away if you are not doing well or get worse.   DENTAL ANTIBIOTICS:  In most cases prophylactic antibiotics for Dental procdeures after total joint surgery are not necessary.  Exceptions are as follows:  1. History of prior total joint infection  2. Severely immunocompromised (Organ Transplant, cancer chemotherapy, Rheumatoid biologic meds such as Humera)  3. Poorly controlled diabetes (A1C &gt; 8.0, blood glucose over 200)  If you have one of these conditions, contact your surgeon for an antibiotic prescription, prior to your dental procedure.    Pick up stool softner and laxative for home use following surgery while on pain medications. Do not submerge incision under water. Please use good hand washing techniques while changing dressing each day. May shower starting three days after surgery. Please use a clean towel to pat the incision dry following showers. Continue to use ice for pain and swelling after surgery. Do not use any lotions or creams on the incision until instructed by your surgeon.  

## 2019-12-22 NOTE — Interval H&P Note (Signed)
History and Physical Interval Note:  12/22/2019 10:43 AM  [name]  has presented today for surgery, with the diagnosis of left knee osteoarthritis.  The various methods of treatment have been discussed with the patient and family. After consideration of risks, benefits and other options for treatment, the patient has consented to  Procedure(s) with comments: TOTAL KNEE ARTHROPLASTY (Left) - 58min as a surgical intervention.  The patient's history has been reviewed, patient examined, no change in status, stable for surgery.  I have reviewed the patient's chart and labs.  Questions were answered to the patient's satisfaction.     Pilar Plate Marty Sadlowski

## 2019-12-22 NOTE — Anesthesia Preprocedure Evaluation (Addendum)
Anesthesia Evaluation  Patient identified by MRN, date of birth, ID band Patient awake    Reviewed: Allergy & Precautions, NPO status , Patient's Chart, lab work & pertinent test results  Airway Mallampati: II  TM Distance: >3 FB Neck ROM: Full    Dental no notable dental hx. (+) Dental Advisory Given   Pulmonary neg pulmonary ROS,    Pulmonary exam normal breath sounds clear to auscultation       Cardiovascular hypertension, Pt. on home beta blockers and Pt. on medications Normal cardiovascular exam+ Valvular Problems/Murmurs  Rhythm:Regular Rate:Normal     Neuro/Psych negative neurological ROS     GI/Hepatic negative GI ROS, Neg liver ROS,   Endo/Other  negative endocrine ROS  Renal/GU negative Renal ROS     Musculoskeletal  (+) Arthritis ,   Abdominal (+) + obese,   Peds  Hematology negative hematology ROS (+)   Anesthesia Other Findings   Reproductive/Obstetrics                            Anesthesia Physical Anesthesia Plan  ASA: II  Anesthesia Plan: Spinal   Post-op Pain Management:  Regional for Post-op pain   Induction: Intravenous  PONV Risk Score and Plan: 3 and Propofol infusion, TIVA, Treatment may vary due to age or medical condition and Ondansetron  Airway Management Planned: Natural Airway  Additional Equipment: None  Intra-op Plan:   Post-operative Plan:   Informed Consent: I have reviewed the patients History and Physical, chart, labs and discussed the procedure including the risks, benefits and alternatives for the proposed anesthesia with the patient or authorized representative who has indicated his/her understanding and acceptance.     Dental advisory given  Plan Discussed with: CRNA  Anesthesia Plan Comments:        Anesthesia Quick Evaluation

## 2019-12-22 NOTE — Anesthesia Procedure Notes (Signed)
Spinal  Patient location during procedure: OR End time: 12/22/2019 12:56 PM Staffing Performed: resident/CRNA  Resident/CRNA: Cynda Familia, CRNA Preanesthetic Checklist Completed: patient identified, IV checked, site marked, risks and benefits discussed, surgical consent, monitors and equipment checked, pre-op evaluation and timeout performed Spinal Block Patient position: sitting Prep: ChloraPrep and site prepped and draped Patient monitoring: heart rate, continuous pulse ox, blood pressure and cardiac monitor Approach: midline Location: L3-4 Injection technique: single-shot Needle Needle type: Sprotte  Needle gauge: 24 G Assessment Sensory level: T4 Additional Notes Expiration date of tray noted and within date.   Patient tolerated procedure well. [place] present assisted and supervised-- prep dry at time of puncture

## 2019-12-22 NOTE — Op Note (Signed)
OPERATIVE REPORT-TOTAL KNEE ARTHROPLASTY   Pre-operative diagnosis- Osteoarthritis  Left knee(s)  Post-operative diagnosis- Osteoarthritis Left knee(s)  Procedure-  Left  Total Knee Arthroplasty  Surgeon- [name]. Gina Dehoyos, MD  Assistant- Gina Barrows, PA-C   Anesthesia-  Adductor canal block and spinal  EBL- 25 ml   Drains Hemovac  Tourniquet time-  Total Tourniquet Time Documented: Thigh (Left) - 36 minutes Total: Thigh (Left) - 36 minutes     Complications- None  Condition-PACU - hemodynamically stable.   Brief Clinical Note  [name] is a 79 y.o. year old female with end stage OA of her left knee with progressively worsening pain and dysfunction. She has constant pain, with activity and at rest and significant functional deficits with difficulties even with ADLs. She has had extensive non-op management including analgesics, injections of cortisone and viscosupplements, and home exercise program, but remains in significant pain with significant dysfunction. Radiographs show bone on bone arthritis lateral and patellofemoral. She presents now for left Total Knee Arthroplasty.    Procedure in detail---   The patient is brought into the operating room and positioned supine on the operating table. After successful administration of  Adductor canal block and spinal,   a tourniquet is placed high on the  Left thigh(s) and the lower extremity is prepped and draped in the usual sterile fashion. Time out is performed by the operating team and then the  Left lower extremity is wrapped in Esmarch, knee flexed and the tourniquet inflated to 300 mmHg.       A midline incision is made with a ten blade through the subcutaneous tissue to the level of the extensor mechanism. A fresh blade is used to make a medial parapatellar arthrotomy. Soft tissue over the proximal medial tibia is subperiosteally elevated to the joint line with a knife and into the semimembranosus bursa with a Cobb  elevator. Soft tissue over the proximal lateral tibia is elevated with attention being paid to avoiding the patellar tendon on the tibial tubercle. The patella is everted, knee flexed 90 degrees and the ACL and PCL are removed. Findings are bone on bone lateral and patellofemoral with large global osteophytes.        The drill is used to create a starting hole in the distal femur and the canal is thoroughly irrigated with sterile saline to remove the fatty contents. The 5 degree Left  valgus alignment guide is placed into the femoral canal and the distal femoral cutting block is pinned to remove 10 mm off the distal femur. Resection is made with an oscillating saw.      The tibia is subluxed forward and the menisci are removed. The extramedullary alignment guide is placed referencing proximally at the medial aspect of the tibial tubercle and distally along the second metatarsal axis and tibial crest. The block is pinned to remove 24mm off the more deficient lateral  side. Resection is made with an oscillating saw. Size 3is the most appropriate size for the tibia and the proximal tibia is prepared with the modular drill and keel punch for that size.      The femoral sizing guide is placed and size 4 is most appropriate. Rotation is marked off the epicondylar axis and confirmed by creating a rectangular flexion gap at 90 degrees. The size 4 cutting block is pinned in this rotation and the anterior, posterior and chamfer cuts are made with the oscillating saw. The intercondylar block is then placed and that cut is made.  Trial size 3 tibial component, trial size 4 narrow posterior stabilized femur and a 10  mm posterior stabilized rotating platform insert trial is placed. Full extension is achieved with excellent varus/valgus and anterior/posterior balance throughout full range of motion. The patella is everted and thickness measured to be 22  mm. Free hand resection is taken to 12 mm, a 35 template is placed,  lug holes are drilled, trial patella is placed, and it tracks normally. Osteophytes are removed off the posterior femur with the trial in place. All trials are removed and the cut bone surfaces prepared with pulsatile lavage. Cement is mixed and once ready for implantation, the size 3 tibial implant, size  4 narrow posterior stabilized femoral component, and the size 35 patella are cemented in place and the patella is held with the clamp. The trial insert is placed and the knee held in full extension. The Exparel (20 ml mixed with 60 ml saline) is injected into the extensor mechanism, posterior capsule, medial and lateral gutters and subcutaneous tissues.  All extruded cement is removed and once the cement is hard the permanent 10 mm posterior stabilized rotating platform insert is placed into the tibial tray.      The wound is copiously irrigated with saline solution and the extensor mechanism closed over a hemovac drain with #1 V-loc suture. The tourniquet is released for a total tourniquet time of 36  minutes. Flexion against gravity is 140 degrees and the patella tracks normally. Subcutaneous tissue is closed with 2.0 vicryl and subcuticular with running 4.0 Monocryl. The incision is cleaned and dried and steri-strips and a bulky sterile dressing are applied. The limb is placed into a knee immobilizer and the patient is awakened and transported to recovery in stable condition.      Please note that a surgical assistant was a medical necessity for this procedure in order to perform it in a safe and expeditious manner. Surgical assistant was necessary to retract the ligaments and vital neurovascular structures to prevent injury to them and also necessary for proper positioning of the limb to allow for anatomic placement of the prosthesis.   [name] Gina Loureiro, MD    12/22/2019, 2:02 PM

## 2019-12-22 NOTE — Transfer of Care (Signed)
Immediate Anesthesia Transfer of Care Note  Patient: [name]  Procedure(s) Performed: TOTAL KNEE ARTHROPLASTY (Left Knee)  Patient Location: PACU  Anesthesia Type:Spinal  Level of Consciousness: awake and alert   Airway & Oxygen Therapy: Patient Spontanous Breathing and Patient connected to face mask oxygen  Post-op Assessment: Report given to RN and Post -op Vital signs reviewed and stable  Post vital signs: Reviewed and stable  Last Vitals:  Vitals Value Taken Time  BP 95/54 12/22/19 1427  Temp    Pulse 76 12/22/19 1429  Resp 10 12/22/19 1429  SpO2 100 % 12/22/19 1429  Vitals shown include unvalidated device data.  Last Pain:  Vitals:   12/22/19 1245  TempSrc:   PainSc: 0-No pain      Patients Stated Pain Goal: 4 (44/36/01 6580)  Complications: No complications documented.

## 2019-12-22 NOTE — Anesthesia Postprocedure Evaluation (Signed)
Anesthesia Post Note  Patient: [name]  Procedure(s) Performed: TOTAL KNEE ARTHROPLASTY (Left Knee)     Patient location during evaluation: PACU Anesthesia Type: Spinal Level of consciousness: awake and alert Pain management: pain level controlled Vital Signs Assessment: post-procedure vital signs reviewed and stable Respiratory status: spontaneous breathing Cardiovascular status: stable Anesthetic complications: no   No complications documented.  Last Vitals:  Vitals:   12/22/19 1430 12/22/19 1445  BP: 95/60 103/62  Pulse: 76 74  Resp: 10 (!) 9  Temp:    SpO2: 100% 100%    Last Pain:  Vitals:   12/22/19 1430  TempSrc:   PainSc: 0-No pain                 Nolon Nations

## 2019-12-22 NOTE — Progress Notes (Signed)
Orthopedic Tech Progress Note Patient Details:  [name] 05/03/1941 [id number]  CPM Left Knee CPM Left Knee: On Left Knee Flexion (Degrees): 40 Left Knee Extension (Degrees): 10  Post Interventions Patient Tolerated: Well Instructions Provided: Care of device, Adjustment of device  Ayla Dunigan 12/22/2019, 4:04 PM

## 2019-12-22 NOTE — Progress Notes (Signed)
Assisted Dr. Lissa Hoard with left, ultrasound guided, adductor canal block. Side rails up, monitors on throughout procedure. See vital signs in flow sheet. Tolerated Procedure well.

## 2019-12-22 NOTE — Plan of Care (Signed)
Discussed with patient and husband about plan of care for post-op day 0. ° ° °Will continue to monitor patient.  ° ° °SWhittemore, RN ° °

## 2019-12-22 NOTE — Anesthesia Procedure Notes (Signed)
Anesthesia Regional Block: Adductor canal block   Pre-Anesthetic Checklist: ,, timeout performed, Correct Patient, Correct Site, Correct Laterality, Correct Procedure, Correct Position, site marked, Risks and benefits discussed,  Surgical consent,  Pre-op evaluation,  At surgeon's request and post-op pain management  Laterality: Left  Prep: chloraprep       Needles:  Injection technique: Single-shot  Needle Type: Stimiplex     Needle Length: 9cm  Needle Gauge: 21     Additional Needles:   Procedures:,,,, ultrasound used (permanent image in chart),,,,  Narrative:  Start time: 12/22/2019 12:06 PM End time: 12/22/2019 12:11 PM Injection made incrementally with aspirations every 5 mL.  Performed by: Personally  Anesthesiologist: Nolon Nations, MD  Additional Notes: BP cuff, EKG monitors applied. Sedation begun. Artery and nerve location verified with U/S and anesthetic injected incrementally, slowly, and after negative aspirations under direct u/s guidance. Good fascial /perineural spread. Tolerated well.

## 2019-12-22 NOTE — Anesthesia Procedure Notes (Signed)
Date/Time: 12/22/2019 12:47 PM Performed by: Cynda Familia, CRNA Pre-anesthesia Checklist: Patient identified, Emergency Drugs available, Suction available, Patient being monitored and Timeout performed Patient Re-evaluated:Patient Re-evaluated prior to induction Oxygen Delivery Method: Simple face mask Placement Confirmation: positive ETCO2 and breath sounds checked- equal and bilateral Dental Injury: Teeth and Oropharynx as per pre-operative assessment

## 2019-12-23 ENCOUNTER — Encounter (HOSPITAL_COMMUNITY): Payer: Self-pay | Admitting: Orthopedic Surgery

## 2019-12-23 DIAGNOSIS — M1712 Unilateral primary osteoarthritis, left knee: Secondary | ICD-10-CM | POA: Diagnosis not present

## 2019-12-23 LAB — BASIC METABOLIC PANEL
Anion gap: 10 (ref 5–15)
BUN: 12 mg/dL (ref 8–23)
CO2: 23 mmol/L (ref 22–32)
Calcium: 7.7 mg/dL — ABNORMAL LOW (ref 8.9–10.3)
Chloride: 107 mmol/L (ref 98–111)
Creatinine, Ser: 0.82 mg/dL (ref 0.44–1.00)
GFR calc Af Amer: >60 mL/min (ref >60)
GFR calc non Af Amer: >60 mL/min (ref >60)
Glucose, Bld: 164 mg/dL — ABNORMAL HIGH (ref 70–99)
Potassium: 4.1 mmol/L (ref 3.5–5.1)
Sodium: 140 mmol/L (ref 135–145)

## 2019-12-23 LAB — CBC
HCT: 37.9 % (ref 36.0–46.0)
Hemoglobin: 12.5 g/dL (ref 12.0–15.0)
MCH: 32.8 pg (ref 26.0–34.0)
MCHC: 33 g/dL (ref 30.0–36.0)
MCV: 99.5 fL (ref 80.0–100.0)
Platelets: 177 10*3/uL (ref 150–400)
RBC: 3.81 MIL/uL — ABNORMAL LOW (ref 3.87–5.11)
RDW: 12.4 % (ref 11.5–15.5)
WBC: 10.4 10*3/uL (ref 4.0–10.5)
nRBC: 0 % (ref 0.0–0.2)

## 2019-12-23 MED ORDER — TRAMADOL HCL 50 MG PO TABS: 50.0000 mg | ORAL_TABLET | Freq: Four times a day (QID) | ORAL | 0 refills | Status: AC | PRN

## 2019-12-23 MED ORDER — METHOCARBAMOL 500 MG PO TABS: 500.0000 mg | ORAL_TABLET | Freq: Four times a day (QID) | ORAL | 0 refills | Status: AC | PRN

## 2019-12-23 MED ORDER — SODIUM CHLORIDE 0.9 % IV BOLUS
250.0000 mL | Freq: Once | INTRAVENOUS | Status: AC
  Administered 2019-12-23: 250 mL via INTRAVENOUS

## 2019-12-23 MED ORDER — HYDROMORPHONE HCL 2 MG PO TABS: 2.0000 mg | ORAL_TABLET | ORAL | 0 refills | Status: AC | PRN

## 2019-12-23 MED ORDER — ONDANSETRON HCL 4 MG PO TABS: 4.0000 mg | ORAL_TABLET | Freq: Four times a day (QID) | ORAL | 0 refills | Status: AC | PRN

## 2019-12-23 MED ORDER — GABAPENTIN 100 MG PO CAPS: ORAL_CAPSULE | ORAL | 0 refills | Status: AC

## 2019-12-23 MED ORDER — ASPIRIN 325 MG PO TBEC: 325.0000 mg | DELAYED_RELEASE_TABLET | Freq: Two times a day (BID) | ORAL | 0 refills | Status: AC

## 2019-12-23 NOTE — Plan of Care (Signed)
Plan of care reviewed and discussed with the patient. 

## 2019-12-23 NOTE — TOC Transition Note (Signed)
Transition of Care (TOC) - CM/SW Discharge Note   Patient Details  Name: [name] MRN: [id number] Date of Birth: 01/08/1941  Transition of Care (TOC) CM/SW Contact:  HOYLE, LUCY, LCSW Phone Number: 12/23/2019, 9:59 AM   Clinical Narrative:     Met briefly with pt and confirmed she already has needed DME.  Plan for OPPT at EO. No TOC needs.  Final next level of care: OP Rehab Barriers to Discharge: No Barriers Identified   Patient Goals and CMS Choice Patient states their goals for this hospitalization and ongoing recovery are:: go home      Discharge Placement                       Discharge Plan and Services                DME Arranged: N/A DME Agency: NA       HH Arranged: NA HH Agency: NA        Social Determinants of Health (SDOH) Interventions     Readmission Risk Interventions No flowsheet data found.     

## 2019-12-23 NOTE — Evaluation (Signed)
Physical Therapy One Time Evaluation Patient Details Name: [name] MRN: [id number] DOB: [birth year] Today's Date: 12/23/2019   History of Present Illness  Pt is a 79 year old female s/p L TKA and has hx of R TKA  Clinical Impression  Patient evaluated by Physical Therapy with no further acute PT needs identified. All education has been completed and the patient has no further questions.  Pt ambulated in hallway and practiced safe stair technique.  Pt did not perform exercises however did demonstrate each exercise (remembers from previous TKA and states she still has exercise handout).  Pt eager to d/c home today. See below for any follow-up Physical Therapy or equipment needs. PT is signing off. Thank you for this referral.     Follow Up Recommendations Follow surgeon's recommendation for DC plan and follow-up therapies    Equipment Recommendations  None recommended by PT    Recommendations for Other Services       Precautions / Restrictions Precautions Precautions: Knee Restrictions Weight Bearing Restrictions: No      Mobility  Bed Mobility Overal bed mobility: Modified Independent                Transfers Overall transfer level: Needs assistance Equipment used: Rolling walker (2 wheeled) Transfers: Sit to/from Stand Sit to Stand: Supervision            Ambulation/Gait Ambulation/Gait assistance: Supervision Gait Distance (Feet): 140 Feet (x2) Assistive device: Rolling walker (2 wheeled) Gait Pattern/deviations: Step-through pattern;Decreased stride length;Antalgic     General Gait Details: able to perform step through gait pattern, performing well with RW  Stairs Stairs: Yes Stairs assistance: Min guard Stair Management: Step to pattern;Forwards;One rail Right Number of Stairs: 3 General stair comments: verbal cues for sequence and safety, min/guard for safety however no physical assist required  Wheelchair Mobility    Modified Rankin  (Stroke Patients Only)       Balance                                             Pertinent Vitals/Pain Pain Assessment: 0-10 Pain Score: 3  Pain Location: L knee Pain Descriptors / Indicators: Aching;Sore Pain Intervention(s): Monitored during session;Repositioned    Home Living Family/patient expects to be discharged to:: Private residence Living Arrangements: Spouse/significant other   Type of Home: House Home Access: Stairs to enter Entrance Stairs-Rails: Right Entrance Stairs-Number of Steps: 3 Home Layout: One level Home Equipment: Walker - 2 wheels      Prior Function Level of Independence: Independent               Hand Dominance        Extremity/Trunk Assessment        Lower Extremity Assessment Lower Extremity Assessment: LLE deficits/detail LLE Deficits / Details: approx 55* AAROM knee flexion observed in supine, able to perform SLR       Communication   Communication: No difficulties  Cognition Arousal/Alertness: Awake/alert Behavior During Therapy: WFL for tasks assessed/performed Overall Cognitive Status: Within Functional Limits for tasks assessed                                        General Comments      Exercises     Assessment/Plan    PT  Assessment All further PT needs can be met in the next venue of care  PT Problem List Decreased strength;Decreased range of motion;Pain       PT Treatment Interventions      PT Goals (Current goals can be found in the Care Plan section)  Acute Rehab PT Goals PT Goal Formulation: All assessment and education complete, DC therapy    Frequency     Barriers to discharge        Co-evaluation               AM-PAC PT "6 Clicks" Mobility  Outcome Measure Help needed turning from your back to your side while in a flat bed without using bedrails?: None Help needed moving from lying on your back to sitting on the side of a flat bed without using  bedrails?: None Help needed moving to and from a bed to a chair (including a wheelchair)?: None Help needed standing up from a chair using your arms (e.g., wheelchair or bedside chair)?: None Help needed to walk in hospital room?: A Little Help needed climbing 3-5 steps with a railing? : A Little 6 Click Score: 22    End of Session Equipment Utilized During Treatment: Gait belt Activity Tolerance: Patient tolerated treatment well Patient left: with call bell/phone within reach;with nursing/sitter in room (about to brush teeth with spouse present) Nurse Communication: Mobility status PT Visit Diagnosis: Difficulty in walking, not elsewhere classified (R26.2)    Time: 6219-4712 PT Time Calculation (min) (ACUTE ONLY): 16 min   Charges:   PT Evaluation $PT Eval Low Complexity: 1 Low        Kati PT, DPT Acute Rehabilitation Services Pager: [phone] Office: [phone]  York Ram E 12/23/2019, 12:30 PM

## 2019-12-23 NOTE — Progress Notes (Signed)
RN reviewed discharge instructions with patient and family. All questions answered.   Paperwork given. Prescriptions electronically sent to patient pharmacy.    NT rolled patient down with all belongings to family car.     Lanika Colgate, RN  

## 2019-12-23 NOTE — Plan of Care (Signed)
Complete

## 2019-12-23 NOTE — Progress Notes (Signed)
   Subjective: 1 Day Post-Op Procedure(s) (LRB): TOTAL KNEE ARTHROPLASTY (Left) Patient reports pain as mild.   Patient seen in rounds by Dr. Wynelle Link. Patient is well, and has had no acute complaints or problems other than pain in the left knee. No issues overnight. Denies chest pain, SOB, or calf pain. Foley catheter to be removed this AM. We will begin therapy.  Objective: Vital signs in last 24 hours: Temp:  [95.9 F (35.5 C)-98.2 F (36.8 C)] 97.7 F (36.5 C) (08/10 0621) Pulse Rate:  [54-79] 54 (08/10 0621) Resp:  [9-22] 16 (08/10 0621) BP: (95-170)/(60-90) 104/62 (08/10 0621) SpO2:  [96 %-100 %] 100 % (08/10 0621) Weight:  [72.7 kg] 72.7 kg (08/09 1618)  Intake/Output from previous day:  Intake/Output Summary (Last 24 hours) at 12/23/2019 0742 Last data filed at 12/23/2019 1941 Gross per 24 hour  Intake 4384.89 ml  Output 2475 ml  Net 1909.89 ml     Intake/Output this shift: No intake/output data recorded.  Labs: Recent Labs    12/23/19 0320  HGB 12.5   Recent Labs    12/23/19 0320  WBC 10.4  RBC 3.81*  HCT 37.9  PLT 177   Recent Labs    12/23/19 0320  NA 140  K 4.1  CL 107  CO2 23  BUN 12  CREATININE 0.82  GLUCOSE 164*  CALCIUM 7.7*   No results for input(s): LABPT, INR in the last 72 hours.  Exam: General - Patient is Alert and Oriented Extremity - Neurologically intact Neurovascular intact Sensation intact distally Dorsiflexion/Plantar flexion intact Dressing - dressing C/D/I Motor Function - intact, moving foot and toes well on exam.   Past Medical History:  Diagnosis Date  . Arthritis   . Cancer ([place])    lt. arm skin cancer  . Heart murmur   . Hypertension     Assessment/Plan: 1 Day Post-Op Procedure(s) (LRB): TOTAL KNEE ARTHROPLASTY (Left) Principal Problem:   OA (osteoarthritis) of knee Active Problems:   Primary osteoarthritis of left knee  Estimated body mass index is 25.87 kg/m as calculated from the following:    Height as of this encounter: 5\' 6"  (1.676 m).   Weight as of this encounter: 72.7 kg. Advance diet Up with therapy   Patient's anticipated LOS is less than 2 midnights, meeting these requirements: - Lives within 1 hour of care - Has a competent adult at home to recover with post-op recover - NO history of  - Chronic pain requiring opioids  - Diabetes  - Coronary Artery Disease  - Heart failure  - Heart attack  - Stroke  - DVT/VTE  - Cardiac arrhythmia  - Respiratory Failure/COPD  - Renal failure  - Anemia  - Advanced Liver disease  DVT Prophylaxis - Aspirin Weight bearing as tolerated. Begin therapy.  BP soft overnight, 250 mL bolus ordered.  Plan is to go Home after hospital stay. Plan for discharge later today if progresses with therapy and meeting goals. Scheduled for outpatient physical therapy at [facility]. Follow-up in the office August 24th.  The PDMP database was reviewed today (12/23/2019) prior to any opioid medications being prescribed to this patient.   Theresa Duty, PA-C Orthopedic Surgery [phone] 12/23/2019, 7:42 AM

## 2019-12-24 NOTE — Discharge Summary (Signed)
Physician Discharge Summary   Patient ID: MELL MELLOTT MRN: [id number] DOB/AGE: 1941-04-04 79 y.o.  Admit date: 12/22/2019 Discharge date: 12/23/2019  Primary Diagnosis: Osteoarthritis, left knee   Admission Diagnoses:  Past Medical History:  Diagnosis Date  . Arthritis   . Cancer ([place])    lt. arm skin cancer  . Heart murmur   . Hypertension    Discharge Diagnoses:   Principal Problem:   OA (osteoarthritis) of knee Active Problems:   Primary osteoarthritis of left knee  Estimated body mass index is 25.87 kg/m as calculated from the following:   Height as of this encounter: 5\' 6"  (1.676 m).   Weight as of this encounter: 72.7 kg.  Procedure:  Procedure(s) (LRB): TOTAL KNEE ARTHROPLASTY (Left)   Consults: None  HPI: [name] is a 79 y.o. year old female with end stage OA of her left knee with progressively worsening pain and dysfunction. She has constant pain, with activity and at rest and significant functional deficits with difficulties even with ADLs. She has had extensive non-op management including analgesics, injections of cortisone and viscosupplements, and home exercise program, but remains in significant pain with significant dysfunction. Radiographs show bone on bone arthritis lateral and patellofemoral. She presents now for left Total Knee Arthroplasty.    Laboratory Data: Admission on 12/22/2019, Discharged on 12/23/2019  Component Date Value Ref Range Status  . ABO/RH(D) 12/22/2019    Final                   Value:O POS Performed at [facility], [place] [address], [place], [place] [address]   . WBC 12/23/2019 10.4  4.0 - 10.5 K/uL Final  . RBC 12/23/2019 3.81* 3.87 - 5.11 MIL/uL Final  . Hemoglobin 12/23/2019 12.5  12.0 - 15.0 g/dL Final  . HCT 12/23/2019 37.9  36 - 46 % Final  . MCV 12/23/2019 99.5  80.0 - 100.0 fL Final  . MCH 12/23/2019 32.8  26.0 - 34.0 pg Final  . MCHC 12/23/2019 33.0  30.0 - 36.0 g/dL Final  . RDW 12/23/2019  12.4  11.5 - 15.5 % Final  . Platelets 12/23/2019 177  150 - 400 K/uL Final  . nRBC 12/23/2019 0.0  0.0 - 0.2 % Final   Performed at [facility], [place] [address] [place], [place] [address]  . Sodium 12/23/2019 140  135 - 145 mmol/L Final  . Potassium 12/23/2019 4.1  3.5 - 5.1 mmol/L Final  . Chloride 12/23/2019 107  98 - 111 mmol/L Final  . CO2 12/23/2019 23  22 - 32 mmol/L Final  . Glucose, Bld 12/23/2019 164* 70 - 99 mg/dL Final   Glucose reference range applies only to samples taken after fasting for at least 8 hours.  . BUN 12/23/2019 12  8 - 23 mg/dL Final  . Creatinine, Ser 12/23/2019 0.82  0.44 - 1.00 mg/dL Final  . Calcium 12/23/2019 7.7* 8.9 - 10.3 mg/dL Final  . GFR calc non Af Amer 12/23/2019 >60  >60 mL/min Final  . GFR calc Af Amer 12/23/2019 >60  >60 mL/min Final  . Anion gap 12/23/2019 10  5 - 15 Final   Performed at [facility], [place] [address], [place],  [address]  Hospital Outpatient Visit on 12/18/2019  Component Date Value Ref Range Status  . SARS Coronavirus 2 12/18/2019 NEGATIVE  NEGATIVE Final   Comment: (NOTE) SARS-CoV-2 target nucleic acids are NOT DETECTED.  The SARS-CoV-2 RNA is generally  detectable in upper and lower respiratory specimens during the acute phase of infection. Negative results do not preclude SARS-CoV-2 infection, do not rule out co-infections with other pathogens, and should not be used as the sole basis for treatment or other patient management decisions. Negative results must be combined with clinical observations, patient history, and epidemiological information. The expected result is Negative.  Fact Sheet for Patients: SugarRoll.be  Fact Sheet for Healthcare Providers: https://www.woods-mathews.com/  This test is not yet approved or cleared by the [place] FDA and  has been authorized for detection and/or diagnosis of SARS-CoV-2 by FDA  under an Emergency Use Authorization (EUA). This EUA will remain  in effect (meaning this test can be used) for the duration of the COVID-19 declaration under Se                          ction 564(b)(1) of the Act, 21 U.S.C. section 360bbb-3(b)(1), unless the authorization is terminated or revoked sooner.  Performed at [facility], [place] [address], [place], [place] [address]   Hospital Outpatient Visit on 12/11/2019  Component Date Value Ref Range Status  . MRSA, PCR 12/11/2019 NEGATIVE  NEGATIVE Final  . Staphylococcus aureus 12/11/2019 NEGATIVE  NEGATIVE Final   Comment: (NOTE) The Xpert SA Assay (FDA approved for NASAL specimens in patients 55 years of age and older), is one component of a comprehensive surveillance program. It is not intended to diagnose infection nor to guide or monitor treatment. Performed at [facility], [place] [address] [place], [place] [address]   . aPTT 12/11/2019 27  24 - 36 seconds Final   Performed at [facility], [place] [address], [place], [place] [address]  . WBC 12/11/2019 7.4  4.0 - 10.5 K/uL Final  . RBC 12/11/2019 4.52  3.87 - 5.11 MIL/uL Final  . Hemoglobin 12/11/2019 14.9  12.0 - 15.0 g/dL Final  . HCT 12/11/2019 44.3  36 - 46 % Final  . MCV 12/11/2019 98.0  80.0 - 100.0 fL Final  . MCH 12/11/2019 33.0  26.0 - 34.0 pg Final  . MCHC 12/11/2019 33.6  30.0 - 36.0 g/dL Final  . RDW 12/11/2019 12.5  11.5 - 15.5 % Final  . Platelets 12/11/2019 192  150 - 400 K/uL Final  . nRBC 12/11/2019 0.0  0.0 - 0.2 % Final   Performed at [facility], [place] [address], [place], [place] [address]  . Sodium 12/11/2019 140  135 - 145 mmol/L Final  . Potassium 12/11/2019 4.6  3.5 - 5.1 mmol/L Final  . Chloride 12/11/2019 105  98 - 111 mmol/L Final  . CO2 12/11/2019 27  22 - 32 mmol/L Final  . Glucose, Bld 12/11/2019 107* 70 - 99 mg/dL Final   Glucose reference range applies only to samples taken after  fasting for at least 8 hours.  . BUN 12/11/2019 16  8 - 23 mg/dL Final  . Creatinine, Ser 12/11/2019 0.69  0.44 - 1.00 mg/dL Final  . Calcium 12/11/2019 9.7  8.9 - 10.3 mg/dL Final  . Total Protein 12/11/2019 6.6  6.5 - 8.1 g/dL Final  . Albumin 12/11/2019 4.1  3.5 - 5.0 g/dL Final  . AST 12/11/2019 17  15 - 41 U/L Final  . ALT 12/11/2019 12  0 - 44 U/L Final  . Alkaline Phosphatase 12/11/2019 64  38 - 126 U/L Final  . Total Bilirubin 12/11/2019 0.4  0.3 - 1.2  mg/dL Final  . GFR calc non Af Amer 12/11/2019 >60  >60 mL/min Final  . GFR calc Af Amer 12/11/2019 >60  >60 mL/min Final  . Anion gap 12/11/2019 8  5 - 15 Final   Performed at [facility], [place] [address] [place], [place] [address]  . Prothrombin Time 12/11/2019 12.9  11.4 - 15.2 seconds Final  . INR 12/11/2019 1.0  0.8 - 1.2 Final   Comment: (NOTE) INR goal varies based on device and disease states. Performed at [facility], [place] [address] [place], [place] [address]   . ABO/RH(D) 12/11/2019 O POS   Final  . Antibody Screen 12/11/2019 NEG   Final  . Sample Expiration 12/11/2019 12/25/2019,2359   Final  . Extend sample reason 12/11/2019    Final                   Value:NO TRANSFUSIONS OR PREGNANCY IN THE PAST 3 MONTHS Performed at [place] [address] [place], [place] [address]      X-Rays:No results found.  EKG:No orders found for this or any previous visit.   Hospital Course: IRAN KIEVIT is a 79 y.o. who was admitted to [facility]. They were brought to the operating room on 12/22/2019 and underwent Procedure(s): TOTAL KNEE ARTHROPLASTY.  Patient tolerated the procedure well and was later transferred to the recovery room and then to the orthopaedic floor for postoperative care. They were given PO and IV analgesics for pain control following their surgery. They were given 24 hours of postoperative antibiotics of  Anti-infectives (From admission,  onward)   Start     Dose/Rate Route Frequency Ordered Stop   12/22/19 2000  ceFAZolin (ANCEF) IVPB 2g/100 mL premix        2 g 200 mL/hr over 30 Minutes Intravenous Every 6 hours 12/22/19 1628 12/23/19 0304   12/22/19 1030  ceFAZolin (ANCEF) IVPB 2g/100 mL premix        2 g 200 mL/hr over 30 Minutes Intravenous On call to O.R. 12/22/19 1015 12/22/19 1257     and started on DVT prophylaxis in the form of Aspirin.   PT and OT were ordered for total joint protocol. Discharge planning consulted to help with postop disposition and equipment needs.  Patient had a good night on the evening of surgery. They started to get up OOB with therapy on POD #1. Pt was seen during rounds and was ready to go home pending progress with therapy. She worked with therapy on POD #1 and was meeting her goals. Pt was discharged to home later that day in stable condition.  Diet: Regular diet Activity: WBAT Follow-up: in 2 weeks Disposition: Home with outpatient physical therapy Discharged Condition: stable   Discharge Instructions    Call MD / Call 911   Complete by: As directed    If you experience chest pain or shortness of breath, CALL 911 and be transported to the hospital emergency room.  If you develope a fever above 101 F, pus (white drainage) or increased drainage or redness at the wound, or calf pain, call your surgeon's office.   Change dressing   Complete by: As directed    You may remove the bulky bandage (ACE wrap and gauze) two days after surgery. You will have an adhesive waterproof bandage underneath. Leave this in place until your first follow-up appointment.   Constipation Prevention   Complete by: As directed    Drink  plenty of fluids.  Prune juice may be helpful.  You may use a stool softener, such as Colace (over the counter) 100 mg twice a day.  Use MiraLax (over the counter) for constipation as needed.   Diet - low sodium heart healthy   Complete by: As directed    Do not put a pillow under  the knee. Place it under the heel.   Complete by: As directed    Driving restrictions   Complete by: As directed    No driving for two weeks   TED hose   Complete by: As directed    Use stockings (TED hose) for three weeks on both leg(s).  You may remove them at night for sleeping.   Weight bearing as tolerated   Complete by: As directed      Allergies as of 12/23/2019      Reactions   Codeine Nausea Only   Other Nausea And Vomiting   opioids   Penicillins Rash   Tolerated Cephalosporin Date: 12/23/19.      Medication List    TAKE these medications   acetaminophen 500 MG tablet Commonly known as: TYLENOL Take 500-1,000 mg by mouth every 6 (six) hours as needed (for pain.).   alendronate 70 MG tablet Commonly known as: FOSAMAX Take 70 mg by mouth every Monday.   amoxicillin 500 MG capsule Commonly known as: AMOXIL Take 2,000 mg by mouth See admin instructions. Take 4 capsules (2000 mg) by mouth 1 hour prior to dental procedure   aspirin 325 MG EC tablet Take 1 tablet (325 mg total) by mouth 2 (two) times daily for 20 days. Then take one 81 mg aspirin once a day for three weeks. Then discontinue aspirin.   NLZJQBHAL-93 EX Apply 1 application topically 3 (three) times daily as needed (itching skin.).   finasteride 5 MG tablet Commonly known as: PROSCAR Take 2.5 mg by mouth daily.   gabapentin 100 MG capsule Commonly known as: NEURONTIN Take a 100 mg capsule three times a day for two weeks following surgery.Then take a 100 mg capsule two times a day for two weeks. Then take a 100 mg capsule once a day for two weeks. Then discontinue.   Glucosamine HCl 1500 MG Tabs Take 1,500 mg by mouth in the morning and at bedtime.   HYDROmorphone 2 MG tablet Commonly known as: DILAUDID Take 1-2 tablets (2-4 mg total) by mouth every 4 (four) hours as needed for severe pain.   ketotifen 0.025 % ophthalmic solution Commonly known as: ZADITOR Place 1 drop into both eyes 2 (two)  times daily as needed (allergy/itching eyes.).   lisinopril 10 MG tablet Commonly known as: ZESTRIL Take 10 mg by mouth daily.   Magnesium 500 MG Tabs Take 500 mg by mouth at bedtime.   methocarbamol 500 MG tablet Commonly known as: ROBAXIN Take 1 tablet (500 mg total) by mouth every 6 (six) hours as needed for muscle spasms.   metoprolol succinate 25 MG 24 hr tablet Commonly known as: TOPROL-XL Take 25 mg by mouth daily.   metroNIDAZOLE 0.75 % cream Commonly known as: METROCREAM Apply 1 application topically 2 (two) times daily as needed (rosacea.).   mometasone 0.1 % cream Commonly known as: ELOCON Apply 1 application topically daily as needed (ear itching).   omeprazole 20 MG tablet Commonly known as: PRILOSEC OTC Take 10-20 mg by mouth daily. Take 1 tablet (20 mg) by mouth on Mondays, Wednesdays, & Fridays at night Take 0.5 tablet (10  mg) by mouth on Sundays, Tuesdays, Thursdays, & Saturdays at night.   ondansetron 4 MG tablet Commonly known as: ZOFRAN Take 1 tablet (4 mg total) by mouth every 6 (six) hours as needed for nausea.   OVER THE COUNTER MEDICATION Take 20 mg by mouth daily. Extract of Saffron   PATADAY OP Place 1 drop into both eyes 3 (three) times daily as needed (allergy/irritated eyes.).   pravastatin 20 MG tablet Commonly known as: PRAVACHOL Take 20 mg by mouth See admin instructions. Takes on Tuesday, Thursday, Saturday, Sunday   pravastatin 10 MG tablet Commonly known as: PRAVACHOL Take 10 mg by mouth every Monday, Wednesday, and Friday.   PRESERVISION AREDS 2 PO Take 1 tablet by mouth in the morning and at bedtime.   traMADol 50 MG tablet Commonly known as: ULTRAM Take 1-2 tablets (50-100 mg total) by mouth every 6 (six) hours as needed for moderate pain.   Vitamin D-3 125 MCG (5000 UT) Tabs Take 5,000 Units by mouth daily.            Discharge Care Instructions  (From admission, onward)         Start     Ordered   12/23/19  0000  Weight bearing as tolerated        08 /10/21 0748   12/23/19 0000  Change dressing       Comments: You may remove the bulky bandage (ACE wrap and gauze) two days after surgery. You will have an adhesive waterproof bandage underneath. Leave this in place until your first follow-up appointment.   12/23/19 0748          Follow-up Information    Gaynelle Arabian, MD. Schedule an appointment as soon as possible for a visit on 01/06/2020.   Specialty: Orthopedic Surgery Contact information: [address] [place] [address] [phone]               Signed: Theresa Duty, PA-C Orthopedic Surgery 12/24/2019, 8:35 AM

## 2020-09-08 IMAGING — MG DIGITAL SCREENING BILATERAL MAMMOGRAM WITH TOMO AND CAD
8 series · 8 of 24 positions shown · non-contrast
Comparison: Previous exam(s).

CLINICAL DATA: Screening.

EXAM:
DIGITAL SCREENING BILATERAL MAMMOGRAM WITH TOMO AND CAD

[R CC synth-2D]
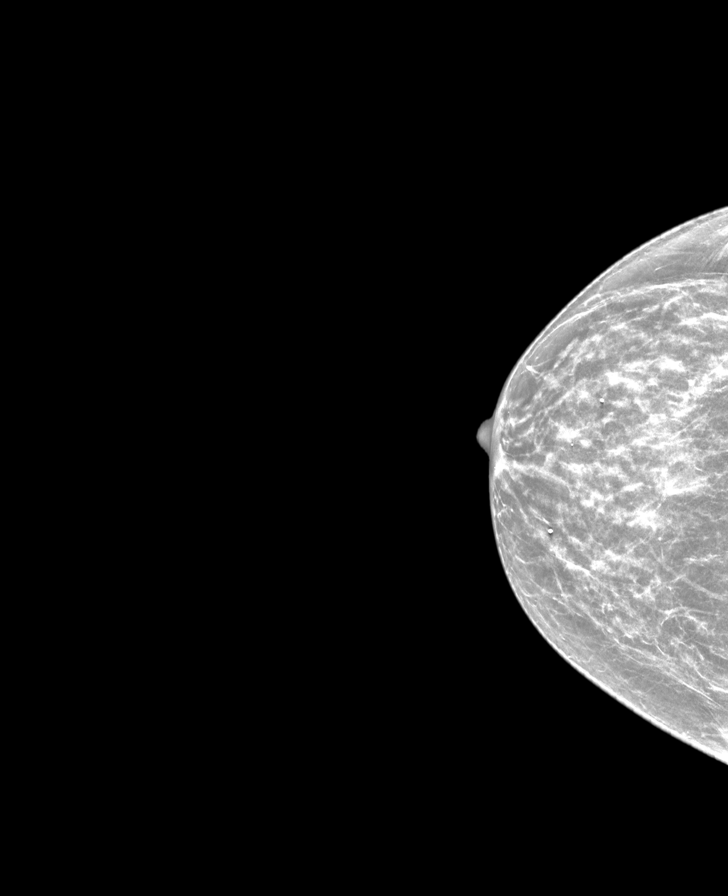

[L MLO synth-2D]
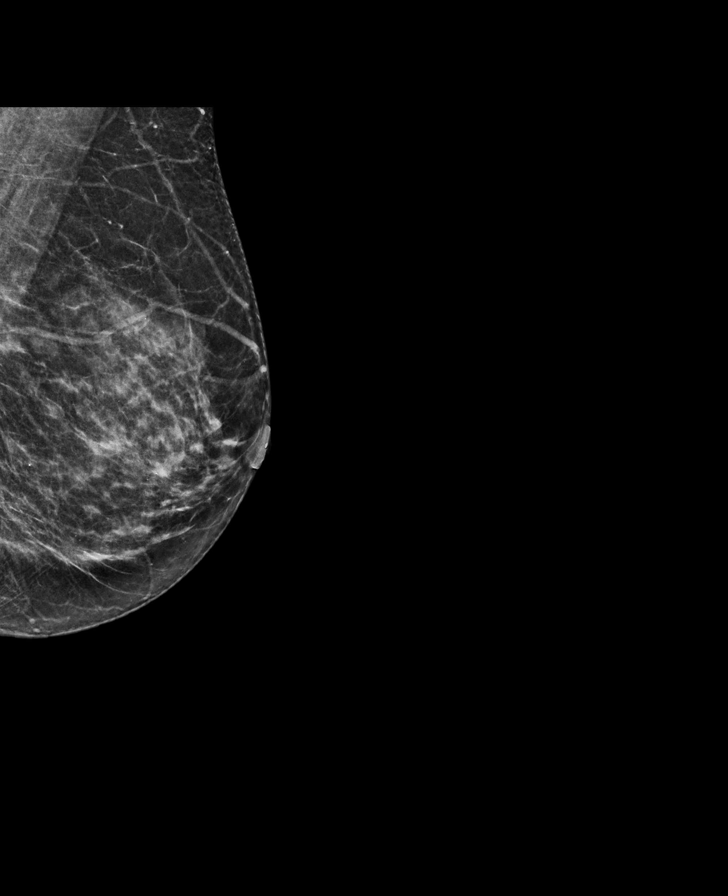

[R MLO synth-2D]
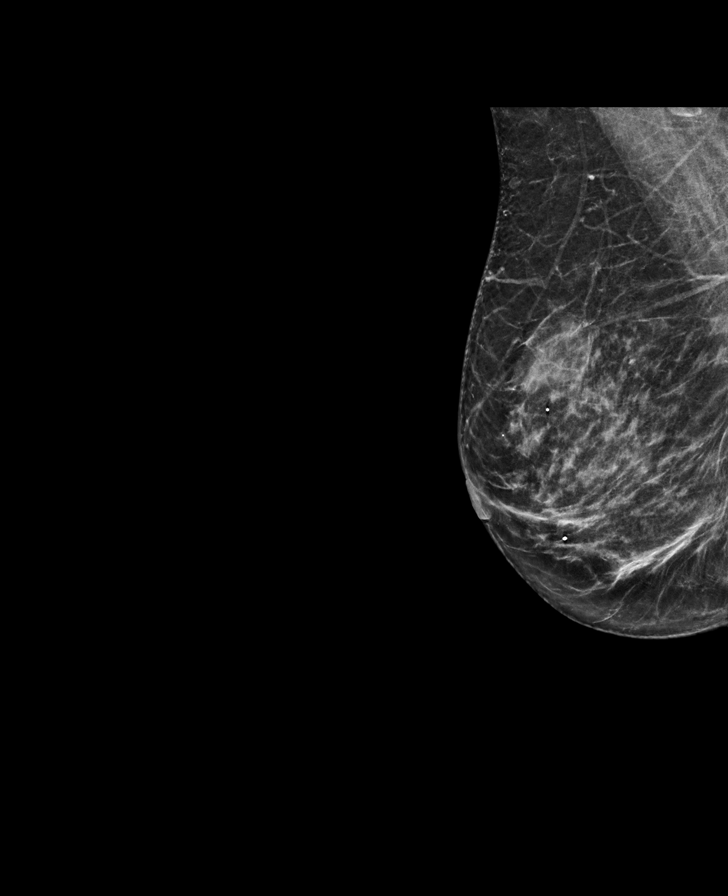

[L CC synth-2D]
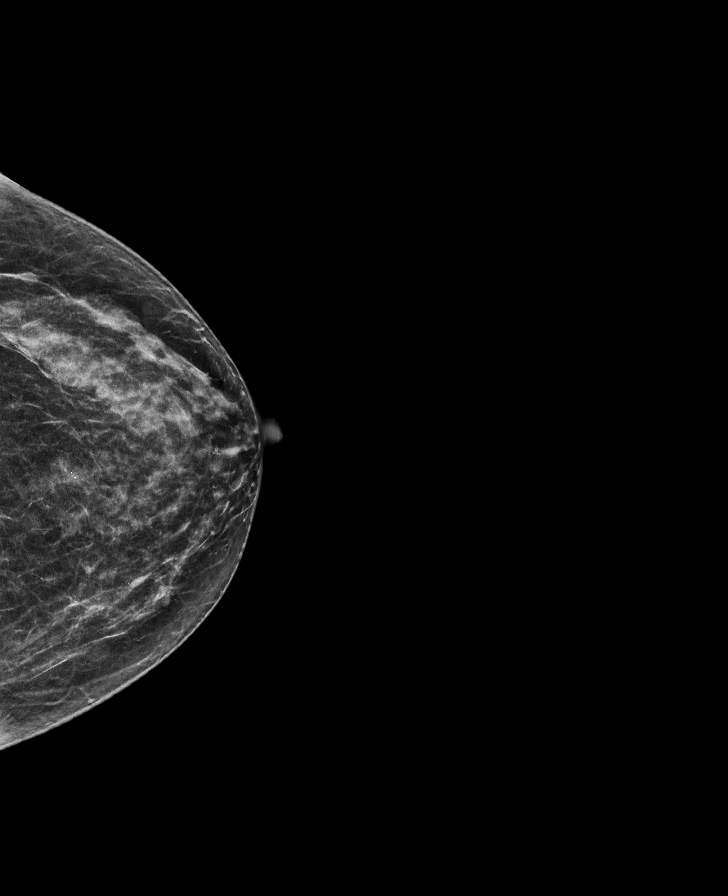

[R CC tomo · tomo slice 27/54.0]
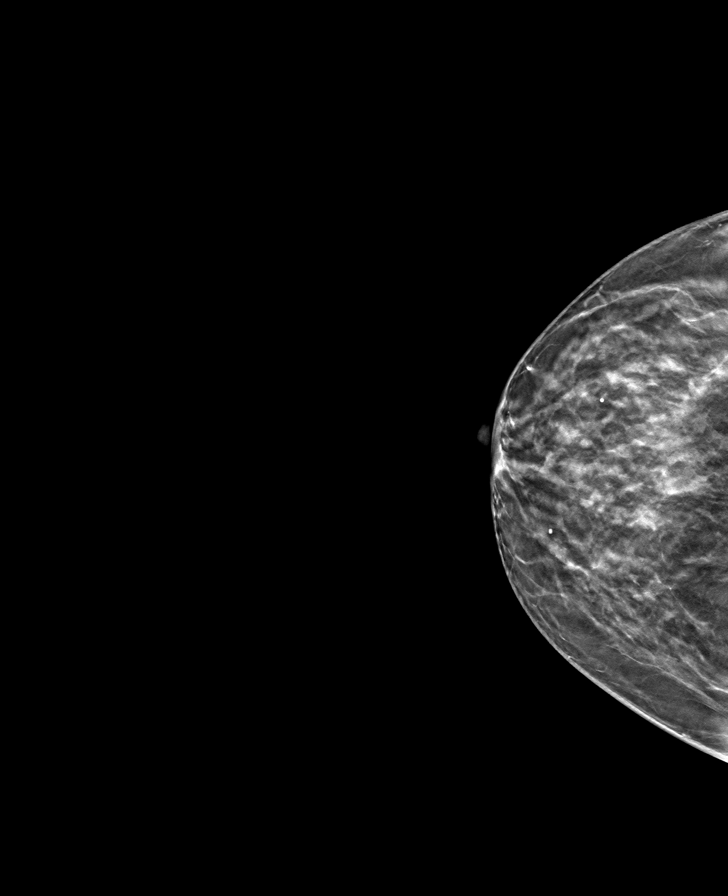

[L MLO tomo · tomo slice 29/57.0]
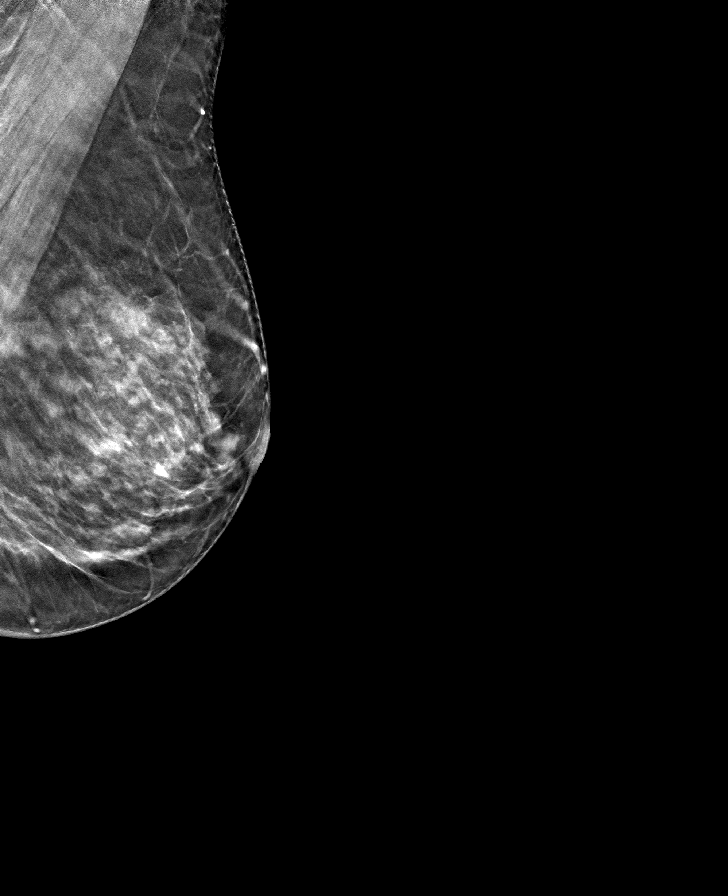

[R MLO tomo · tomo slice 31/60.0]
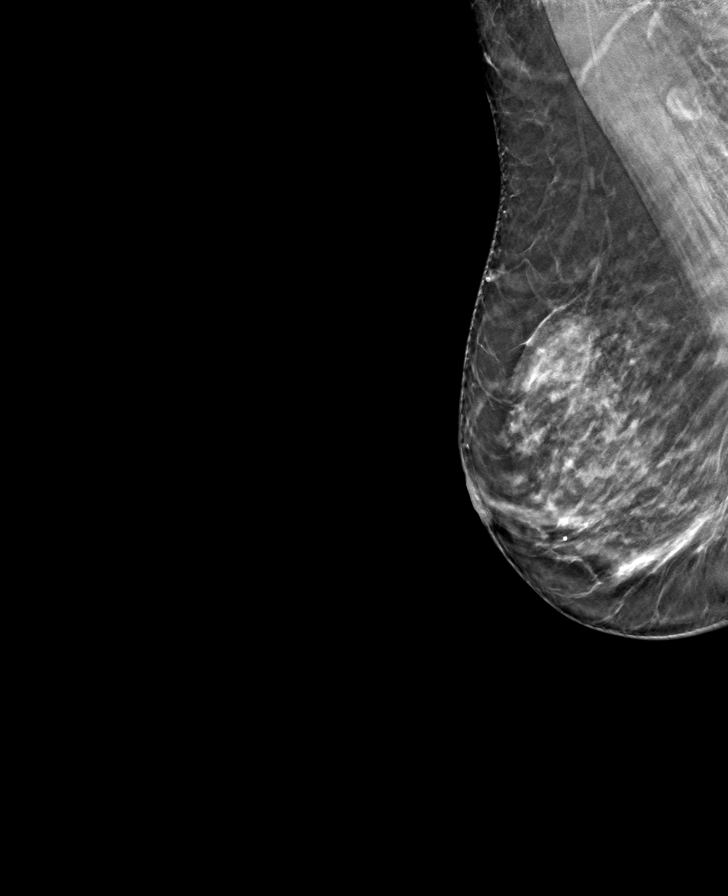

[L CC tomo · tomo slice 29/57.0]
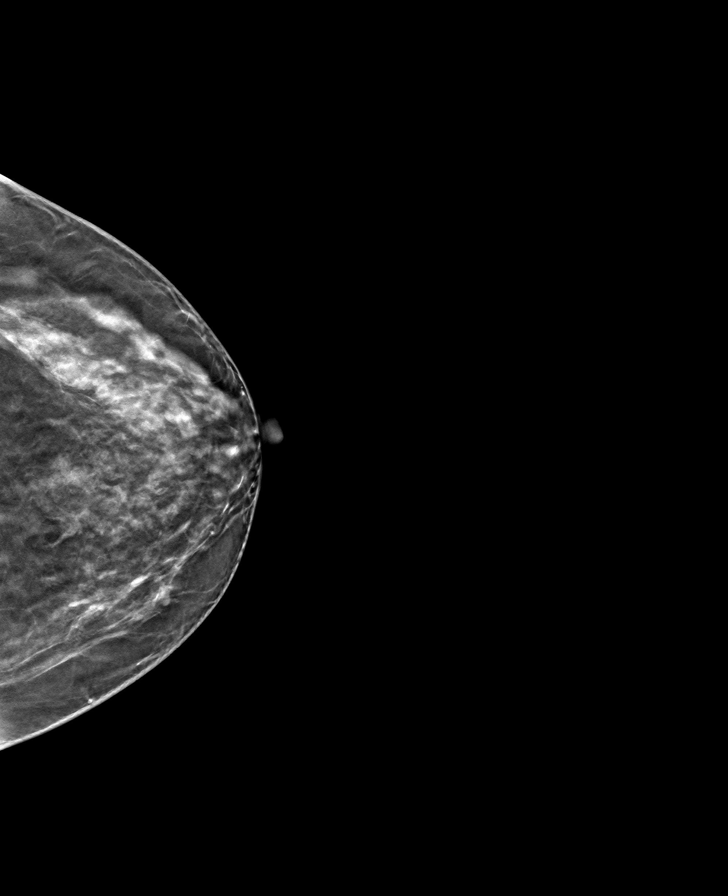

[8 of 24 positions shown; findings below may reference images not displayed]

ACR Breast Density Category c: The breast tissue is heterogeneously
dense, which may obscure small masses.
FINDINGS: There are no findings suspicious for malignancy. Images were
processed with CAD.
IMPRESSION: No mammographic evidence of malignancy. A result letter of this
screening mammogram will be mailed directly to the patient.

RECOMMENDATION:
Screening mammogram in one year. (Code:[id number])

BI-RADS CATEGORY  1: Negative.
